# Patient Record
Sex: Male | Born: 1955 | Race: White | Hispanic: No | Marital: Married | Smoking: Former smoker
Health system: Southern US, Community
[De-identification: ages and names within clinical notes are randomized; demographics above are authoritative.]

## PROBLEM LIST (undated history)

## (undated) DIAGNOSIS — E119 Type 2 diabetes mellitus without complications: Secondary | ICD-10-CM

## (undated) HISTORY — PX: CYST REMOVAL TRUNK: SHX6283

---

## 2006-08-10 ENCOUNTER — Ambulatory Visit: Payer: Self-pay | Admitting: Surgery

## 2006-08-10 ENCOUNTER — Other Ambulatory Visit: Payer: Self-pay

## 2006-08-16 ENCOUNTER — Ambulatory Visit: Payer: Self-pay | Admitting: Surgery

## 2011-01-12 ENCOUNTER — Ambulatory Visit: Payer: Self-pay

## 2012-07-31 ENCOUNTER — Ambulatory Visit: Payer: Self-pay | Admitting: Emergency Medicine

## 2014-03-02 ENCOUNTER — Ambulatory Visit: Payer: Self-pay | Admitting: Family Medicine

## 2014-03-26 ENCOUNTER — Ambulatory Visit: Payer: Self-pay | Admitting: Gastroenterology

## 2014-04-29 ENCOUNTER — Ambulatory Visit
Admit: 2014-04-29 | Disposition: A | Discharge status: Home / Self Care | Payer: Self-pay | Attending: Family Medicine | Admitting: Family Medicine

## 2015-08-05 ENCOUNTER — Ambulatory Visit
Admission: EM | Admit: 2015-08-05 | Discharge: 2015-08-05 | Disposition: A | Discharge status: Home / Self Care | Payer: BLUE CROSS/BLUE SHIELD | Attending: Emergency Medicine | Admitting: Emergency Medicine

## 2015-08-05 DIAGNOSIS — M545 Low back pain, unspecified: Secondary | ICD-10-CM

## 2015-08-05 HISTORY — DX: Type 2 diabetes mellitus without complications: E11.9

## 2015-08-05 MED ORDER — METHOCARBAMOL 750 MG PO TABS: 1500.0000 mg | ORAL_TABLET | Freq: Three times a day (TID) | ORAL | Status: DC | PRN

## 2015-08-05 MED ORDER — PREDNISONE 10 MG PO TABS: ORAL_TABLET | ORAL | Status: DC

## 2015-08-05 NOTE — ED Notes (Signed)
Patient complains of right lower back pain. Patient states that when he bends over and then stands back up he feels a catching sensation in his back. Patient states that he felt a pulling in his back over a week ago when he got out of his truck. Patient states that pain has been going on now for about 4 weeks.

## 2015-08-05 NOTE — Discharge Instructions (Signed)
Take medication as prescribed. Rest. Drink plenty of fluids. Stretch. Avoid re-injury. Monitor blood sugar as discussed with medication.   Follow up with your primary care physician next week as scheduled. Return to Urgent care for new or worsening concerns.     Back Pain, Adult Back pain is very common in adults.The cause of back pain is rarely dangerous and the pain often gets better over time.The cause of your back pain may not be known. Some common causes of back pain include: 1. Strain of the muscles or ligaments supporting the spine. 2. Wear and tear (degeneration) of the spinal disks. 3. Arthritis. 4. Direct injury to the back. For many people, back pain may return. Since back pain is rarely dangerous, most people can learn to manage this condition on their own. HOME CARE INSTRUCTIONS Watch your back pain for any changes. The following actions may help to lessen any discomfort you are feeling: 1. Remain active. It is stressful on your back to sit or stand in one place for long periods of time. Do not sit, drive, or stand in one place for more than 30 minutes at a time. Take short walks on even surfaces as soon as you are able.Try to increase the length of time you walk each day. 2. Exercise regularly as directed by your health care provider. Exercise helps your back heal faster. It also helps avoid future injury by keeping your muscles strong and flexible. 3. Do not stay in bed.Resting more than 1-2 days can delay your recovery. 4. Pay attention to your body when you bend and lift. The most comfortable positions are those that put less stress on your recovering back. Always use proper lifting techniques, including: 1. Bending your knees. 2. Keeping the load close to your body. 3. Avoiding twisting. 5. Find a comfortable position to sleep. Use a firm mattress and lie on your side with your knees slightly bent. If you lie on your back, put a pillow under your knees. 6. Avoid feeling  anxious or stressed.Stress increases muscle tension and can worsen back pain.It is important to recognize when you are anxious or stressed and learn ways to manage it, such as with exercise. 7. Take medicines only as directed by your health care provider. Over-the-counter medicines to reduce pain and inflammation are often the most helpful.Your health care provider may prescribe muscle relaxant drugs.These medicines help dull your pain so you can more quickly return to your normal activities and healthy exercise. 8. Apply ice to the injured area: 1. Put ice in a plastic bag. 2. Place a towel between your skin and the bag. 3. Leave the ice on for 20 minutes, 2-3 times a day for the first 2-3 days. After that, ice and heat may be alternated to reduce pain and spasms. 9. Maintain a healthy weight. Excess weight puts extra stress on your back and makes it difficult to maintain good posture. SEEK MEDICAL CARE IF: 1. You have pain that is not relieved with rest or medicine. 2. You have increasing pain going down into the legs or buttocks. 3. You have pain that does not improve in one week. 4. You have night pain. 5. You lose weight. 6. You have a fever or chills. SEEK IMMEDIATE MEDICAL CARE IF:  1. You develop new bowel or bladder control problems. 2. You have unusual weakness or numbness in your arms or legs. 3. You develop nausea or vomiting. 4. You develop abdominal pain. 5. You feel faint.   This  information is not intended to replace advice given to you by your health care provider. Make sure you discuss any questions you have with your health care provider.   Document Released: 01/05/2005 Document Revised: 01/26/2014 Document Reviewed: 05/09/2013 Elsevier Interactive Patient Education 2016 [employer] Injury Prevention Back injuries can be very painful. They can also be difficult to heal. After having one back injury, you are more likely to injure your back again. It is  important to learn how to avoid injuring or re-injuring your back. The following tips can help you to prevent a back injury. WHAT SHOULD I KNOW ABOUT PHYSICAL FITNESS? 5. Exercise for 30 minutes per day on most days of the week or as directed by your health care provider. Make sure to: 1. Do aerobic exercises, such as walking, jogging, biking, or swimming. 2. Do exercises that increase balance and strength, such as tai chi and yoga. These can decrease your risk of falling and injuring your back. 3. Do stretching exercises to help with flexibility. 4. Try to develop strong abdominal muscles. Your abdominal muscles provide a lot of the support that is needed by your back. 6. Maintain a healthy weight. This helps to decrease your risk of a back injury. WHAT SHOULD I KNOW ABOUT MY DIET? 10. Talk with your health care provider about your overall diet. Take supplements and vitamins only as directed by your health care provider. 11. Talk with your health care provider about how much calcium and vitamin D you need each day. These nutrients help to prevent weakening of the bones (osteoporosis). Osteoporosis can cause broken (fractured) bones, which lead to back pain. 12. Include good sources of calcium in your diet, such as dairy products, green leafy vegetables, and products that have had calcium added to them (fortified). 13. Include good sources of vitamin D in your diet, such as milk and foods that are fortified with vitamin D. WHAT SHOULD I KNOW ABOUT MY POSTURE? 7. Sit up straight and stand up straight. Avoid leaning forward when you sit or hunching over when you stand. 8. Choose chairs that have good low-back (lumbar) support. 9. If you work at a desk, sit close to it so you do not need to lean over. Keep your chin tucked in. Keep your neck drawn back, and keep your elbows bent at a right angle. Your arms should look like the letter "L." 10. Sit high and close to the steering wheel when you drive.  Add a lumbar support to your car seat, if needed. 11. Avoid sitting or standing in one position for very long. Take breaks to get up, stretch, and walk around at least one time every hour. Take breaks every hour if you are driving for long periods of time. 12. Sleep on your side with your knees slightly bent, or sleep on your back with a pillow under your knees. Do not lie on the front of your body to sleep. WHAT SHOULD I KNOW ABOUT LIFTING, TWISTING, AND REACHING? Lifting and Heavy Lifting 6. Avoid heavy lifting, especially repetitive heavy lifting. If you must do heavy lifting: 1. Stretch before lifting. 2. Work slowly. 3. Rest between lifts. 4. Use a tool such as a cart or a dolly to move objects if one is available. 5. Make several small trips instead of carrying one heavy load. 6. Ask for help when you need it, especially when moving big objects. 7. Follow these steps when lifting: 1. Stand with your feet shoulder-width apart. 2.  Get as close to the object as you can. Do not try to pick up a heavy object that is far from your body. 3. Use handles or lifting straps if they are available. 4. Bend at your knees. Squat down, but keep your heels off the floor. 5. Keep your shoulders pulled back, your chin tucked in, and your back straight. 6. Lift the object slowly while you tighten the muscles in your legs, abdomen, and buttocks. Keep the object as close to the center of your body as possible. 8. Follow these steps when putting down a heavy load: 1. Stand with your feet shoulder-width apart. 2. Lower the object slowly while you tighten the muscles in your legs, abdomen, and buttocks. Keep the object as close to the center of your body as possible. 3. Keep your shoulders pulled back, your chin tucked in, and your back straight. 4. Bend at your knees. Squat down, but keep your heels off the floor. 5. Use handles or lifting straps if they are available. Twisting and Reaching 1. Avoid lifting  heavy objects above your waist. 2. Do not twist at your waist while you are lifting or carrying a load. If you need to turn, move your feet. 3. Do not bend over without bending at your knees. 4. Avoid reaching over your head, across a table, or for an object on a high surface. WHAT ARE SOME OTHER TIPS? 1. Avoid wet floors and icy ground. Keep sidewalks clear of ice to prevent falls. 2. Do not sleep on a mattress that is too soft or too hard. 3. Keep items that are used frequently within easy reach. 4. Put heavier objects on shelves at waist level, and put lighter objects on lower or higher shelves. 5. Find ways to decrease your stress, such as exercise, massage, or relaxation techniques. Stress can build up in your muscles. Tense muscles are more vulnerable to injury. 6. Talk with your health care provider if you feel anxious or depressed. These conditions can make back pain worse. 7. Wear flat heel shoes with cushioned soles. 8. Avoid sudden movements. 9. Use both shoulder straps when carrying a backpack. 10. Do not use any tobacco products, including cigarettes, chewing tobacco, or electronic cigarettes. If you need help quitting, ask your health care provider.   This information is not intended to replace advice given to you by your health care provider. Make sure you discuss any questions you have with your health care provider.   Document Released: 02/13/2004 Document Revised: 05/22/2014 Document Reviewed: 01/09/2014 Elsevier Interactive Patient Education 2016 [employer]  Back Exercises The following exercises strengthen the muscles that help to support the back. They also help to keep the lower back flexible. Doing these exercises can help to prevent back pain or lessen existing pain. If you have back pain or discomfort, try doing these exercises 2-3 times each day or as told by your health care provider. When the pain goes away, do them once each day, but increase the number of  times that you repeat the steps for each exercise (do more repetitions). If you do not have back pain or discomfort, do these exercises once each day or as told by your health care provider. EXERCISES Single Knee to Chest Repeat these steps 3-5 times for each leg: 7. Lie on your back on a firm bed or the floor with your legs extended. 8. Bring one knee to your chest. Your other leg should stay extended and in contact with the floor.  9. Hold your knee in place by grabbing your knee or thigh. 10. Pull on your knee until you feel a gentle stretch in your lower back. 11. Hold the stretch for 10-30 seconds. 12. Slowly release and straighten your leg. Pelvic Tilt Repeat these steps 5-10 times: 14. Lie on your back on a firm bed or the floor with your legs extended. [place] your knees so they are pointing toward the ceiling and your feet are flat on the floor. 50. Tighten your lower abdominal muscles to press your lower back against the floor. This motion will tilt your pelvis so your tailbone points up toward the ceiling instead of pointing to your feet or the floor. 17. With gentle tension and even breathing, hold this position for 5-10 seconds. Cat-Cow Repeat these steps until your lower back becomes more flexible: 13. Get into a hands-and-knees position on a firm surface. Keep your hands under your shoulders, and keep your knees under your hips. You may place padding under your knees for comfort. 14. Let your head hang down, and point your tailbone toward the floor so your lower back becomes rounded like the back of a cat. 15. Hold this position for 5 seconds. 16. Slowly lift your head and point your tailbone up toward the ceiling so your back forms a sagging arch like the back of a cow. 17. Hold this position for 5 seconds. Press-Ups Repeat these steps 5-10 times: 9. Lie on your abdomen (face-down) on the floor. 10. Place your palms near your head, about shoulder-width apart. 11. While you  keep your back as relaxed as possible and keep your hips on the floor, slowly straighten your arms to raise the top half of your body and lift your shoulders. Do not use your back muscles to raise your upper torso. You may adjust the placement of your hands to make yourself more comfortable. 12. Hold this position for 5 seconds while you keep your back relaxed. 13. Slowly return to lying flat on the floor. [place] Repeat these steps 10 times: 5. Lie on your back on a firm surface. 6. Bend your knees so they are pointing toward the ceiling and your feet are flat on the floor. 7. Tighten your buttocks muscles and lift your buttocks off of the floor until your waist is at almost the same height as your knees. You should feel the muscles working in your buttocks and the back of your thighs. If you do not feel these muscles, slide your feet 1-2 inches farther away from your buttocks. 8. Hold this position for 3-5 seconds. 9. Slowly lower your hips to the starting position, and allow your buttocks muscles to relax completely. If this exercise is too easy, try doing it with your arms crossed over your chest. Abdominal Crunches Repeat these steps 5-10 times: 11. Lie on your back on a firm bed or the floor with your legs extended. 12. Bend your knees so they are pointing toward the ceiling and your feet are flat on the floor. 55. Cross your arms over your chest. 14. Tip your chin slightly toward your chest without bending your neck. 73. Tighten your abdominal muscles and slowly raise your trunk (torso) high enough to lift your shoulder blades a tiny bit off of the floor. Avoid raising your torso higher than that, because it can put too much stress on your low back and it does not help to strengthen your abdominal muscles. 16. Slowly return to your starting position. Back Lifts  Repeat these steps 5-10 times: 1. Lie on your abdomen (face-down) with your arms at your sides, and rest your forehead on the  floor. 2. Tighten the muscles in your legs and your buttocks. 3. Slowly lift your chest off of the floor while you keep your hips pressed to the floor. Keep the back of your head in line with the curve in your back. Your eyes should be looking at the floor. 4. Hold this position for 3-5 seconds. 5. Slowly return to your starting position. SEEK MEDICAL CARE IF:  Your back pain or discomfort gets much worse when you do an exercise.  Your back pain or discomfort does not lessen within 2 hours after you exercise. If you have any of these problems, stop doing these exercises right away. Do not do them again unless your health care provider says that you can. SEEK IMMEDIATE MEDICAL CARE IF:  You develop sudden, severe back pain. If this happens, stop doing the exercises right away. Do not do them again unless your health care provider says that you can.   This information is not intended to replace advice given to you by your health care provider. Make sure you discuss any questions you have with your health care provider.   Document Released: 02/13/2004 Document Revised: 09/26/2014 Document Reviewed: 03/01/2014 Elsevier Interactive Patient Education [employer].

## 2015-08-05 NOTE — ED Provider Notes (Signed)
[name]  ____________________________________________  Time seen: Approximately 9:02 AM  I have reviewed the triage vital signs and the nursing notes.   HISTORY  Chief Complaint Back Pain   HPI Edward Gomez is a 60 y.o. male presents with a complaint of lower back pain that has been present intermittently for the last 4 weeks. Patient reports pain started 4 weeks ago is a very mild pain that has gradually worsened. Patient states that initially pain would come and go but states the last 2 weeks pain has been more persistent. Patient states that pain is only present with active movement, mostly with bending down and back up, describes as a catching pain. Denies any pain when sitting still or lying still. States pain is in his right low back.  Denies any other pain. Denies pain radiation. Denies numbness or tingling sensation, urinary or bowel retention or incontinence, sensation changes, weakness, extremity pain, extremity swelling. Patient denies any fall or trauma. Patient does report that 2 weeks ago he did twist his back which caused some increased pain. Patient states that he twisted his back when stepping down out of his truck but states he did not fall, and denies any direct trauma. Denies any known trauma or injury. Denies pain at this time. States works at a [employer], but denies heavy lifting or injury.  Patient reports he does have a history of similar approximate 2 years ago that he was treated with baclofen. Patient states he does have a few baclofen at home which he is taking which has not helped very much. Patient reports over-the-counter ibuprofen does help some. Reports has also taken Tylenol arthritis.  Denies fevers, pain radiation, sensation changes, weakness, chest pain, shortness of breath, abdominal pain, urinary or bowel complaints or recent sickness. Patient reports he has primary care appointment next week.  PCP: Kandice Robinsons   Past  Medical History  Diagnosis Date  . Diabetes ([place])     There are no active problems to display for this patient.   Past Surgical History  Procedure Laterality Date  . Cyst removal trunk      Current Outpatient Rx  Name  Route  Sig  Dispense  Refill  . lisinopril (PRINIVIL,ZESTRIL) 2.5 MG tablet   Oral   Take 2.5 mg by mouth daily.         . metFORMIN (GLUCOPHAGE) 500 MG tablet   Oral   Take 1,000 mg by mouth 2 (two) times daily with a meal.         .           .             Allergies Review of patient's allergies indicates no known allergies.  Family History  Problem Relation Age of Onset  . Cancer Father     Social History Social History  Substance Use Topics  . Smoking status: Never Smoker   . Smokeless tobacco: None  . Alcohol Use: 0.0 oz/week    0 Standard drinks or equivalent per week     Comment: occasionally    Review of Systems Constitutional: No fever/chills Eyes: No visual changes. ENT: No sore throat. Cardiovascular: Denies chest pain. Respiratory: Denies shortness of breath. Gastrointestinal: No abdominal pain.  No nausea, no vomiting.  No diarrhea.  No constipation. Genitourinary: Negative for dysuria. Musculoskeletal: positive for back pain. Skin: Negative for rash. Neurological: Negative for headaches, focal weakness or numbness.  10-point ROS otherwise negative.  ____________________________________________  PHYSICAL EXAM:  VITAL SIGNS: ED Triage Vitals  Enc Vitals Group     BP 08/05/15 0824 110/57 mmHg     Pulse Rate 08/05/15 0824 72     Resp 08/05/15 0824 17     Temp 08/05/15 0824 97.8 F (36.6 C)     Temp Source 08/05/15 0824 Oral     SpO2 08/05/15 0824 96 %     Weight 08/05/15 0824 298 lb (135.172 kg)     Height 08/05/15 0824 5\' 10"  (1.778 m)     Head Cir --      Peak Flow --      Pain Score 08/05/15 0823 7     Pain Loc --      Pain Edu? --      Excl. in [place]? --     Constitutional: Alert and oriented. Well  appearing and in no acute distress. Eyes: Conjunctivae are normal. PERRL. EOMI. Head: Atraumatic.  Ears: normal external appearance bilaterally.   Nose: No congestion/rhinnorhea.  Mouth/Throat: Mucous membranes are moist.  Oropharynx non-erythematous. Neck: No stridor.  No cervical spine tenderness to palpation. Cardiovascular: Normal rate, regular rhythm. Grossly normal heart sounds.  Good peripheral circulation. Respiratory: Normal respiratory effort.  No retractions. Lungs CTAB.No wheezes, rales or rhonchi.  Gastrointestinal: Soft and nontender. Obese abdomen.  Musculoskeletal: No lower or upper extremity tenderness nor edema. Bilateral pedal pulses equal and easily palpated. No midline cervical, thoracic or lumbar tenderness to palpation. Except: Mild tenderness to palpation right paralumbar, no midline tenderness, no left lower tenderness, no swelling, no ecchymosis, full range of motion, mild pain present with lumbar flexion and extension, bilateral straight leg test negative, no saddle anesthesia, steady gait. Patient changes positions from sitting to standing to ambulate quickly. Neurologic:  Normal speech and language. No gross focal neurologic deficits are appreciated. No gait instability. Skin:  Skin is warm, dry and intact. No rash noted. Psychiatric: Mood and affect are normal. Speech and behavior are normal.  ____________________________________________   LABS (all labs ordered are listed, but only abnormal results are displayed)  Labs Reviewed - No data to display ____________________________________________  RADIOLOGY  No results found. Patient declines _____________________________   INITIAL IMPRESSION / ASSESSMENT AND PLAN / ED COURSE  Pertinent labs & imaging results that were available during my care of the patient were reviewed by me and considered in my medical decision making (see chart for details).  Very well-appearing patient. No acute distress. Presents  for the complaints of right low back pain. Patient states pain is been present 4 weeks. Patient reports pain is only with position changes. Denies current pain sitting still. Patient mild tenderness to right para lumbar, no midline tenderness. Discussed evaluation and treatment options with patient, discussed x-ray with patient, patient declines x-ray at this time and reports will treat with medications and if pain continues, he will then have x-ray at that time. Will treat with oral prednisone taper and when necessary Robaxin. Encouraged patient to monitor blood sugar closely with prednisone. Encourage stretching, ice, proper body mechanics and avoidance of reinjury.Discussed indication, risks and benefits of medications with patient. Encouraged PCP follow-up and patient reports he has PCP follow up next week.   Discussed follow up with Primary care physician this week. Discussed follow up and return parameters including no resolution or any worsening concerns. Patient verbalized understanding and agreed to plan.   ____________________________________________   FINAL CLINICAL IMPRESSION(S) / ED DIAGNOSES  Final diagnoses:  Right-sided low back pain without sciatica  Discharge Medication List as of 08/05/2015  8:56 AM    START taking these medications   Details  methocarbamol (ROBAXIN-750) 750 MG tablet Take 2 tablets (1,500 mg total) by mouth every 8 (eight) hours as needed for muscle spasms (Do not drive or operate heavy machinery while taking medication as can cause drowsiness.)., Starting 08/05/2015, Until Discontinued, Print    predniSONE (DELTASONE) 10 MG tablet Start 60 mg po day one, then 50 mg po day two, taper by 10 mg daily until complete., Normal        Note: This dictation was prepared with Dragon dictation along with smaller phrase technology. Any transcriptional errors that result from this process are unintentional.       Marylene Land, NP 08/05/15 [phone]

## 2017-01-30 ENCOUNTER — Other Ambulatory Visit: Payer: Self-pay

## 2017-01-30 ENCOUNTER — Ambulatory Visit
Admission: EM | Admit: 2017-01-30 | Discharge: 2017-01-30 | Disposition: A | Discharge status: Home / Self Care | Payer: BLUE CROSS/BLUE SHIELD | Attending: Family Medicine | Admitting: Family Medicine

## 2017-01-30 DIAGNOSIS — M79671 Pain in right foot: Secondary | ICD-10-CM | POA: Diagnosis not present

## 2017-01-30 MED ORDER — NAPROXEN 500 MG PO TABS: 500.0000 mg | ORAL_TABLET | Freq: Two times a day (BID) | ORAL | 0 refills | Status: DC

## 2017-01-30 NOTE — ED Triage Notes (Signed)
Patient states he was on a cruise 3 weeks ago and thinks he "pulled" something in his right heel while pushing a wheelchair. Right heel pain x 3 weeks.

## 2017-01-30 NOTE — Discharge Instructions (Signed)
This is either achilles enthesopathy or bursitis around the achilles.  Rest.  Use the medication as directed.   If no improvement, please see podiatry.  Take care  Dr. Lacinda Axon

## 2017-01-30 NOTE — ED Provider Notes (Signed)
[name]   CSN: [id number] Arrival date & time: 01/30/17  0825  History   Chief Complaint Chief Complaint  Patient presents with  . Foot Pain   HPI  62 year old male presents with heel pain.  Patient reports that he has had this issue for the past 3 weeks.  He states that he was on a cruise and was pushing his wife in a wheelchair.  He subsequently developed pain of the right heel, posteriorly.  He states that he has continued to have pain despite rest, Tylenol, and topical agents.  Pain is moderate in severity, 7/10.  Worse with activity.  Improves with rest.  He denies any fall or twisting injury.  No other associated symptoms.  No other complaints at this time.  PMH - morbid obesity, hypertension, hyperlipidemia, type 2 diabetes with peripheral neuropathy  Surgical Hx -tonsillectomy, vasectomy  Home Medications    Prior to Admission medications   Medication Sig Start Date End Date Taking? Authorizing Provider  GLIMEPIRIDE PO Take by mouth.   Yes [provider]  lisinopril (PRINIVIL,ZESTRIL) 2.5 MG tablet Take 2.5 mg by mouth daily.   Yes [provider]  metFORMIN (GLUCOPHAGE) 500 MG tablet Take 1,000 mg by mouth 2 (two) times daily with a meal.   Yes [provider]  methocarbamol (ROBAXIN-750) 750 MG tablet Take 2 tablets (1,500 mg total) by mouth every 8 (eight) hours as needed for muscle spasms (Do not drive or operate heavy machinery while taking medication as can cause drowsiness.). 08/05/15  Yes Marylene Land, NP  naproxen (NAPROSYN) 500 MG tablet Take 1 tablet (500 mg total) by mouth 2 (two) times daily with a meal. 01/30/17   Coral Spikes, DO    Family History Diabetes type II Father    Gout Father    Depression Mother    Diabetes type II Mother    High blood pressure (Hypertension) Mother    Hyperlipidemia (Elevated cholesterol) Mother    Reflux disease Mother    Stroke Mother    Alcohol abuse Sister     Depression Sister     Social History Social History   Tobacco Use  . Smoking status: Never Smoker  . Smokeless tobacco: Never Used  Substance Use Topics  . Alcohol use: Yes    Alcohol/week: 0.0 oz    Comment: occasionally  . Drug use: No     Allergies   Patient has no known allergies.   Review of Systems Review of Systems  Constitutional: Negative.   Musculoskeletal:       Heel pain, right. No redness/warmth.   Physical Exam Triage Vital Signs ED Triage Vitals  Enc Vitals Group     BP 01/30/17 0849 (!) 115/57     Pulse Rate 01/30/17 0849 76     Resp --      Temp 01/30/17 0849 97.9 F (36.6 C)     Temp Source 01/30/17 0849 Oral     SpO2 01/30/17 0849 96 %     Weight 01/30/17 0849 295 lb (133.8 kg)     Height 01/30/17 0849 5\' 10"  (1.778 m)     Head Circumference --      Peak Flow --      Pain Score 01/30/17 0850 7     Pain Loc --      Pain Edu? --      Excl. in [place]? --    Updated Vital Signs BP (!) 115/57 (BP Location: Left Arm)  Pulse 76   Temp 97.9 F (36.6 C) (Oral)   Ht 5\' 10"  (1.778 m)   Wt 295 lb (133.8 kg)   SpO2 96%   BMI 42.33 kg/m     Physical Exam  Constitutional: He is oriented to person, place, and time. He appears well-developed. No distress.  Cardiovascular: Normal rate and regular rhythm.  No murmur heard. Pulmonary/Chest: Effort normal and breath sounds normal. He has no wheezes. He has no rales.  Musculoskeletal:  Right heel -patient with tenderness around the attachment site of the Achilles tendon.  No erythema.  No warmth.  Normal range of motion of the ankle.  No tenderness of the ankle.  Neurological: He is alert and oriented to person, place, and time.  Psychiatric: He has a normal mood and affect. His behavior is normal.  Nursing note and vitals reviewed.  UC Treatments / Results  Labs (all labs ordered are listed, but only abnormal results are displayed) Labs Reviewed - No data to display  EKG  EKG  Interpretation None       Radiology No results found.  Procedures Procedures (including critical care time)  Medications Ordered in UC Medications - No data to display   Initial Impression / Assessment and Plan / UC Course  I have reviewed the triage vital signs and the nursing notes.  Pertinent labs & imaging results that were available during my care of the patient were reviewed by me and considered in my medical decision making (see chart for details).     62 year old male presents with heel pain.  Achilles enthesopathy vs bursitis.  Treating with rest, brief course of naproxen, start of exercises/stretches.  Final Clinical Impressions(s) / UC Diagnoses   Final diagnoses:  Pain of right heel    ED Discharge Orders        Ordered    naproxen (NAPROSYN) 500 MG tablet  2 times daily with meals     01/30/17 0918     Controlled Substance Prescriptions [place] Controlled Substance Registry consulted? Not Applicable   Coral Spikes, [place] 01/30/17 1975

## 2017-02-02 ENCOUNTER — Telehealth: Payer: Self-pay | Admitting: Emergency Medicine

## 2017-02-02 NOTE — Telephone Encounter (Signed)
Left message to follow-up with patient regarding recent visit at [facility].

## 2017-02-06 ENCOUNTER — Other Ambulatory Visit: Payer: Self-pay | Admitting: Family Medicine

## 2017-02-20 ENCOUNTER — Other Ambulatory Visit: Payer: Self-pay

## 2017-02-20 ENCOUNTER — Ambulatory Visit
Admission: EM | Admit: 2017-02-20 | Discharge: 2017-02-20 | Disposition: A | Discharge status: Home / Self Care | Payer: BLUE CROSS/BLUE SHIELD | Attending: Family Medicine | Admitting: Family Medicine

## 2017-02-20 DIAGNOSIS — M62830 Muscle spasm of back: Secondary | ICD-10-CM

## 2017-02-20 MED ORDER — CYCLOBENZAPRINE HCL 10 MG PO TABS: 10.0000 mg | ORAL_TABLET | Freq: Three times a day (TID) | ORAL | 0 refills | Status: DC | PRN

## 2017-02-20 NOTE — ED Provider Notes (Signed)
[name]    CSN: [id number] Arrival date & time: 02/20/17  5621  History   Chief Complaint Chief Complaint  Patient presents with  . Back Pain   HPI  62 year old male presents with muscle spasms.  Started on Wednesday.  He states that he has had this previously.  He took baclofen without significant improvement.  He has had improvement with Flexeril previously.  His spasms are located in the musculature along the thoracic and lumbar spines.  Worse with movement and prolonged sitting.  No known relieving factors.  No other associated symptoms.  No other complaints at this time.  Past Medical History:  Diagnosis Date  . Diabetes [facility])    Past Surgical History:  Procedure Laterality Date  . CYST REMOVAL TRUNK      Home Medications    Prior to Admission medications   Medication Sig Start Date End Date Taking? Authorizing Provider  cyclobenzaprine (FLEXERIL) 10 MG tablet Take 1 tablet (10 mg total) by mouth 3 (three) times daily as needed. 02/20/17   Thersa Salt G, DO  GLIMEPIRIDE PO Take by mouth.    [provider]  lisinopril (PRINIVIL,ZESTRIL) 2.5 MG tablet Take 2.5 mg by mouth daily.    [provider]  metFORMIN (GLUCOPHAGE) 500 MG tablet Take 1,000 mg by mouth 2 (two) times daily with a meal.    [provider]    Family History Family History  Problem Relation Age of Onset  . Cancer Father     Social History Social History   Tobacco Use  . Smoking status: Never Smoker  . Smokeless tobacco: Never Used  Substance Use Topics  . Alcohol use: Yes    Alcohol/week: 0.0 oz    Comment: occasionally  . Drug use: No     Allergies   Patient has no known allergies.   Review of Systems Review of Systems  Constitutional: Negative.   Musculoskeletal:       Muscle cramps/spasms.   Physical Exam Triage Vital Signs ED Triage Vitals  Enc Vitals Group     BP 02/20/17 0903 (!) 169/85     Pulse Rate 02/20/17 0900 72     Resp  02/20/17 0900 18     Temp 02/20/17 0900 97.9 F (36.6 C)     Temp Source 02/20/17 0900 Oral     SpO2 02/20/17 0900 98 %     Weight 02/20/17 0905 295 lb (133.8 kg)     Height 02/20/17 0905 5\' 9"  (1.753 m)     Head Circumference --      Peak Flow --      Pain Score 02/20/17 0954 7     Pain Loc --      Pain Edu? --      Excl. in [place]? --    Updated Vital Signs BP (!) 169/85 (BP Location: Right Arm)   Pulse 72   Temp 97.9 F (36.6 C) (Oral)   Resp 18   Ht 5\' 9"  (1.753 m)   Wt 295 lb (133.8 kg)   SpO2 98%   BMI 43.56 kg/m   Physical Exam  Constitutional: He is oriented to person, place, and time. He appears well-developed and well-nourished. No distress.  Cardiovascular: Normal rate and regular rhythm.  Pulmonary/Chest: Effort normal and breath sounds normal.  Musculoskeletal:  Thoracic and lumbar spines -decreased range of motion secondary to pain.  Mild tenderness of the surrounding musculature.  Neurological: He is alert and oriented to person, place, and  time.  Psychiatric: He has a normal mood and affect. His behavior is normal.  Nursing note and vitals reviewed.    UC Treatments / Results  Labs (all labs ordered are listed, but only abnormal results are displayed) Labs Reviewed - No data to display  EKG  EKG Interpretation None       Radiology No results found.  Procedures Procedures (including critical care time)  Medications Ordered in UC Medications - No data to display   Initial Impression / Assessment and Plan / UC Course  I have reviewed the triage vital signs and the nursing notes.  Pertinent labs & imaging results that were available during my care of the patient were reviewed by me and considered in my medical decision making (see chart for details).     62 year old male presents with muscle spasms.  Treating with Flexeril.  Final Clinical Impressions(s) / UC Diagnoses   Final diagnoses:  Muscle spasm of back    ED Discharge Orders          Ordered    cyclobenzaprine (FLEXERIL) 10 MG tablet  3 times daily PRN     02/20/17 0951     Controlled Substance Prescriptions [place] Controlled Substance Registry consulted? Not Applicable   Coral Spikes, DO 02/20/17 1027

## 2017-02-20 NOTE — ED Triage Notes (Signed)
Pt with low/mid back spasms starting Wednesday night. Had some Baclofen which helped "a little" but still having pain and spasms. No injury. Pt reports this has happened before and Flexeril seemed to help.

## 2017-05-08 ENCOUNTER — Ambulatory Visit (INDEPENDENT_AMBULATORY_CARE_PROVIDER_SITE_OTHER): Payer: Worker's Compensation

## 2017-05-08 ENCOUNTER — Encounter: Payer: Self-pay | Admitting: Gynecology

## 2017-05-08 ENCOUNTER — Ambulatory Visit
Admission: EM | Admit: 2017-05-08 | Discharge: 2017-05-08 | Disposition: A | Discharge status: Home / Self Care | Payer: Worker's Compensation | Attending: Family Medicine | Admitting: Family Medicine

## 2017-05-08 ENCOUNTER — Other Ambulatory Visit: Payer: Self-pay

## 2017-05-08 DIAGNOSIS — M25571 Pain in right ankle and joints of right foot: Secondary | ICD-10-CM

## 2017-05-08 DIAGNOSIS — M25522 Pain in left elbow: Secondary | ICD-10-CM

## 2017-05-08 DIAGNOSIS — M25732 Osteophyte, left wrist: Secondary | ICD-10-CM | POA: Diagnosis not present

## 2017-05-08 DIAGNOSIS — W19XXXA Unspecified fall, initial encounter: Secondary | ICD-10-CM | POA: Diagnosis not present

## 2017-05-08 DIAGNOSIS — M25532 Pain in left wrist: Secondary | ICD-10-CM

## 2017-05-08 DIAGNOSIS — S52122A Displaced fracture of head of left radius, initial encounter for closed fracture: Secondary | ICD-10-CM

## 2017-05-08 MED ORDER — TRAMADOL HCL 50 MG PO TABS: 50.0000 mg | ORAL_TABLET | Freq: Three times a day (TID) | ORAL | 0 refills | Status: DC | PRN

## 2017-05-08 NOTE — Discharge Instructions (Signed)
Call [facility] or [facility] on Monday.  Be sure to use the sling.  Pain medication as needed.  Take care  Dr. Lacinda Axon

## 2017-05-08 NOTE — ED Provider Notes (Addendum)
[name]    CSN: [id number] Arrival date & time: 05/08/17  0800  History   Chief Complaint Chief Complaint  Patient presents with  . Fall   HPI  62 year old male presents with left wrist, left elbow, and right ankle pain after suffering a fall last night.  Patient states that he was at work last night and slipped on concrete due to the mud.  He fell on outstretched left hand. Patient reports that he has mild pain of his wrist but good range of motion.  His elbow pain is his primary concern.  Pain is severe.  Worse with range of motion.  Patient states that he is unable to extend his elbow.  No medications or interventions tried.  Additionally, patient reports right ankle pain with activity.  No other associated symptoms.  No other complaints.  Past Medical History:  Diagnosis Date  . Diabetes [facility])    Past Surgical History:  Procedure Laterality Date  . CYST REMOVAL TRUNK     Home Medications    Prior to Admission medications   Medication Sig Start Date End Date Taking? Authorizing Provider  GLIMEPIRIDE PO Take by mouth.   Yes [provider]  lisinopril (PRINIVIL,ZESTRIL) 2.5 MG tablet Take 2.5 mg by mouth daily.   Yes [provider]  metFORMIN (GLUCOPHAGE) 500 MG tablet Take 1,000 mg by mouth 2 (two) times daily with a meal.   Yes [provider]  traMADol (ULTRAM) 50 MG tablet Take 1 tablet (50 mg total) by mouth every 8 (eight) hours as needed. 05/08/17   Coral Spikes, DO   Family History Family History  Problem Relation Age of Onset  . Cancer Father    Social History Social History   Tobacco Use  . Smoking status: Never Smoker  . Smokeless tobacco: Never Used  Substance Use Topics  . Alcohol use: Yes    Alcohol/week: 0.0 oz    Comment: occasionally  . Drug use: No   Allergies   Patient has no known allergies.  Review of Systems Review of Systems  Constitutional: Negative.   Musculoskeletal:       Wrist pain,  Elbow pain and decreased ROM, Right ankle pain.   Physical Exam Triage Vital Signs ED Triage Vitals  Enc Vitals Group     BP 05/08/17 0811 (!) 142/84     Pulse Rate 05/08/17 0811 77     Resp 05/08/17 0811 16     Temp 05/08/17 0811 98.1 F (36.7 C)     Temp Source 05/08/17 0811 Oral     SpO2 05/08/17 0811 97 %     Weight 05/08/17 0814 300 lb (136.1 kg)     Height 05/08/17 0814 5\' 9"  (1.753 m)     Head Circumference --      Peak Flow --      Pain Score 05/08/17 0814 10     Pain Loc --      Pain Edu? --      Excl. in [place]? --    Updated Vital Signs BP (!) 142/84 (BP Location: Right Arm)   Pulse 77   Temp 98.1 F (36.7 C) (Oral)   Resp 16   Ht 5\' 9"  (1.753 m)   Wt 300 lb (136.1 kg)   SpO2 97%   BMI 44.30 kg/m   Physical Exam  Constitutional: He is oriented to person, place, and time. He appears well-developed. No distress.  HENT:  Head: Normocephalic and atraumatic.  Cardiovascular:  Normal rate and regular rhythm.  Pulmonary/Chest: Effort normal and breath sounds normal.  Musculoskeletal:  Left wrist -good range of motion.  No discrete areas of tenderness.  Left elbow -markedly tender to palpation around the radial head.  Decreased range of motion particularly in extension.  Right ankle -no appreciable swelling.  No tenderness of the foot or malleoli.  Neurological: He is alert and oriented to person, place, and time.  Psychiatric: He has a normal mood and affect. His behavior is normal.  Nursing note and vitals reviewed.  UC Treatments / Results  Labs (all labs ordered are listed, but only abnormal results are displayed) Labs Reviewed - No data to display  EKG None Radiology Dg Elbow Complete Left  Result Date: 05/08/2017 CLINICAL DATA:  Pt slipped and fell last night landing on left arm and wrist. C/o diffuse pain in left elbow and swelling and pain in left wrist. Best images possible, pt unable to straighten left elbow. EXAM: LEFT ELBOW - COMPLETE 3+ VIEW  COMPARISON:  None. FINDINGS: Radial head fracture, minimally displaced. Elbow effusion. Normal alignment. Degenerative spurs from the olecranon and coronoid process of the ulna. Normal mineralization. Adjacent to the coronary process of the ulna IMPRESSION: Minimally displaced radial head fracture with effusion. Electronically Signed   By: Lucrezia Europe M.D.   On: 05/08/2017 09:06   Dg Wrist Complete Left  Result Date: 05/08/2017 CLINICAL DATA:  Pt slipped and fell last night landing on left arm and wrist. C/o diffuse pain in left elbow and swelling and pain in left wrist. Best images possible, pt unable to straighten left elbow. EXAM: LEFT WRIST - COMPLETE 3+ VIEW COMPARISON:  None. FINDINGS: Small ossific density at the dorsal margin of the distal radial articular surface favor small spur over fracture fragment. There is no other evidence of fracture or dislocation. There is no other evidence of arthropathy or other focal bone abnormality. Soft tissues are unremarkable. IMPRESSION: Small dorsal distal radial spur versus fracture fragment. Correlate with point tenderness. Electronically Signed   By: Lucrezia Europe M.D.   On: 05/08/2017 09:04    Procedures Procedures (including critical care time)  Medications Ordered in UC Medications - No data to display   Initial Impression / Assessment and Plan / UC Course  I have reviewed the triage vital signs and the nursing notes.  Pertinent labs & imaging results that were available during my care of the patient were reviewed by me and considered in my medical decision making (see chart for details).     62 year old male presents for evaluation following a fall.  Exam of the elbow concerning for fracture.  Radial head fracture noted on x-ray.  Treating with tramadol and placing in a sling.  Advised to see orthopedics on Monday.  Final Clinical Impressions(s) / UC Diagnoses   Final diagnoses:  Closed displaced fracture of head of left radius, initial  encounter    ED Discharge Orders        Ordered    traMADol (ULTRAM) 50 MG tablet  Every 8 hours PRN     05/08/17 0912     Controlled Substance Prescriptions [place] Controlled Substance Registry consulted? Not Applicable   Coral Spikes, DO 05/08/17 [place], Lillie, DO 05/11/17 1121

## 2017-05-08 NOTE — ED Notes (Signed)
Sling applied to left arm.

## 2017-05-08 NOTE — ED Triage Notes (Signed)
Per patient fell x last night and injury his left arm and right ankle.

## 2017-11-21 ENCOUNTER — Ambulatory Visit
Admission: EM | Admit: 2017-11-21 | Discharge: 2017-11-21 | Disposition: A | Discharge status: Home / Self Care | Payer: BLUE CROSS/BLUE SHIELD

## 2017-11-21 ENCOUNTER — Encounter: Payer: Self-pay | Admitting: Gynecology

## 2017-11-21 ENCOUNTER — Ambulatory Visit (INDEPENDENT_AMBULATORY_CARE_PROVIDER_SITE_OTHER): Payer: BLUE CROSS/BLUE SHIELD

## 2017-11-21 DIAGNOSIS — M545 Low back pain, unspecified: Secondary | ICD-10-CM

## 2017-11-21 DIAGNOSIS — M5136 Other intervertebral disc degeneration, lumbar region: Secondary | ICD-10-CM

## 2017-11-21 MED ORDER — DICLOFENAC SODIUM 1 % TD GEL: 2.0000 g | Freq: Four times a day (QID) | TRANSDERMAL | 0 refills | Status: DC

## 2017-11-21 MED ORDER — CYCLOBENZAPRINE HCL 10 MG PO TABS: ORAL_TABLET | ORAL | 0 refills | Status: AC

## 2017-11-21 NOTE — ED Triage Notes (Signed)
Patient c/o back pain x 3-4 weeks.

## 2017-11-21 NOTE — ED Provider Notes (Signed)
[name]    CSN: [id number] Arrival date & time: 11/21/17  1210     History   Chief Complaint No chief complaint on file.   HPI Edward Gomez is a 62 y.o. male.   62 year old male presents with intermittent back pain for the past 3 to 4 weeks. Has had lower back pain and muscle spasms in the past but this pain feels different. No distinct injury or trauma. Indicates the pain feels like achy and stabbing pain on the right lower lumbar area and it feels better with massage and pressure to the area. He has tried Ibuprofen the first week or two with minimal relief. Recently he tried Baclofen with no relief. Indicates that he has had Flexeril in the past which seems to work better. Denies any fever, chest pain, difficulty breathing, coughing, abdominal pain, dysuria, or change in bowel habit. Other chronic health issues include DM and hyperlipidemia and currently on Metformin, Glimepiride, and aspirin daily. Indicates he takes Lisinopril for kidney protection from diabetes and does not have HTN.   The history is provided by the patient.    Past Medical History:  Diagnosis Date  . Diabetes ([place])     There are no active problems to display for this patient.   Past Surgical History:  Procedure Laterality Date  . CYST REMOVAL TRUNK         Home Medications    Prior to Admission medications   Medication Sig Start Date End Date Taking? Authorizing Provider  aspirin EC 81 MG tablet Take by mouth.   Yes [provider]  b complex vitamins tablet Take by mouth.   Yes [provider]  GLIMEPIRIDE PO Take by mouth.   Yes [provider]  lisinopril (PRINIVIL,ZESTRIL) 2.5 MG tablet Take 2.5 mg by mouth daily.   Yes [provider]  metFORMIN (GLUCOPHAGE) 500 MG tablet Take 1,000 mg by mouth 2 (two) times daily with a meal.   Yes [provider]  cyclobenzaprine (FLEXERIL) 10 MG tablet Take 1/2 to 1 whole tablet by  mouth every 8 hours as needed for back pain/spasms. 11/21/17   Katy Apo, NP  diclofenac sodium (VOLTAREN) 1 % GEL Apply 2 g topically 4 (four) times daily. 11/21/17   Katy Apo, NP    Family History Family History  Problem Relation Age of Onset  . Cancer Father     Social History Social History   Tobacco Use  . Smoking status: Never Smoker  . Smokeless tobacco: Never Used  Substance Use Topics  . Alcohol use: Yes    Alcohol/week: 0.0 standard drinks    Comment: occasionally  . Drug use: No     Allergies   Lovastatin and Pravastatin   Review of Systems Review of Systems  Constitutional: Negative for activity change, appetite change, chills, diaphoresis, fatigue, fever and unexpected weight change.  Eyes: Negative for photophobia and visual disturbance.  Respiratory: Negative for cough, chest tightness, shortness of breath and wheezing.   Cardiovascular: Negative for chest pain and palpitations.  Gastrointestinal: Negative for abdominal pain, nausea and vomiting.  Genitourinary: Negative for decreased urine volume, difficulty urinating, dysuria, flank pain, hematuria and urgency.  Musculoskeletal: Positive for back pain and myalgias. Negative for arthralgias, gait problem, neck pain and neck stiffness.  Skin: Negative for color change, rash and wound.  Neurological: Negative for dizziness, tremors, seizures, syncope, facial asymmetry, speech difficulty, weakness, light-headedness, numbness and headaches.  Hematological: Negative for  adenopathy. Does not bruise/bleed easily.  Psychiatric/Behavioral: Negative.      Physical Exam Triage Vital Signs ED Triage Vitals  Enc Vitals Group     BP 11/21/17 1248 134/74     Pulse Rate 11/21/17 1248 68     Resp --      Temp 11/21/17 1248 98 F (36.7 C)     Temp src --      SpO2 11/21/17 1248 98 %     Weight 11/21/17 1247 300 lb (136.1 kg)     Height 11/21/17 1247 5\' 9"  (1.753 m)     Head Circumference --       Peak Flow --      Pain Score 11/21/17 1246 5     Pain Loc --      Pain Edu? --      Excl. in [place]? --    No data found.  Updated Vital Signs BP 134/74 (BP Location: Left Arm)   Pulse 68   Temp 98 F (36.7 C)   Ht 5\' 9"  (1.753 m)   Wt 300 lb (136.1 kg)   SpO2 98%   BMI 44.30 kg/m   Visual Acuity Right Eye Distance:   Left Eye Distance:   Bilateral Distance:    Right Eye Near:   Left Eye Near:    Bilateral Near:     Physical Exam  Constitutional: He is oriented to person, place, and time. Vital signs are normal. He appears well-developed and well-nourished. He is cooperative. He does not appear ill. No distress.  Patient sitting comfortably in exam chair in no acute distress. Indicates leaning on back of chair helps with pain.   HENT:  Head: Normocephalic and atraumatic.  Right Ear: External ear normal.  Left Ear: External ear normal.  Eyes: Conjunctivae and EOM are normal.  Neck: Normal range of motion. Neck supple.  Cardiovascular: Normal rate, regular rhythm and normal heart sounds.  No murmur heard. Pulmonary/Chest: Effort normal and breath sounds normal. No stridor. No respiratory distress. He has no decreased breath sounds. He has no wheezes. He has no rhonchi. He has no rales.  Musculoskeletal: He exhibits tenderness.       Lumbar back: He exhibits decreased range of motion, tenderness, pain and spasm. He exhibits no swelling, no edema, no deformity and no laceration.       Back:  Has decreased range of motion of lower back, especially with flexion. Pain with any movement. Right lower lumbar area with occasional muscle spasms and slightly tender. No swelling, redness or rash. No distinct neuro deficits noted. Good distal pulses and sensation.   Lymphadenopathy:    He has no cervical adenopathy.  Neurological: He is alert and oriented to person, place, and time. He has normal strength and normal reflexes. No sensory deficit. He displays a negative Romberg sign. Gait  normal.  Skin: Skin is warm and dry. Capillary refill takes less than 2 seconds. No rash noted. No erythema.  Psychiatric: He has a normal mood and affect. His behavior is normal. Judgment and thought content normal.  Vitals reviewed.    UC Treatments / Results  Labs (all labs ordered are listed, but only abnormal results are displayed) Labs Reviewed - No data to display  EKG None  Radiology Dg Lumbar Spine Complete  Result Date: 11/21/2017 CLINICAL DATA:  Right lumbar pain. EXAM: LUMBAR SPINE - COMPLETE 4+ VIEW COMPARISON:  None. FINDINGS: There is no evidence of lumbar spine fracture. Alignment is normal. Degenerative disc  disease with disc height loss and facet arthropathy at L4-5 and L5-S1. Generalized osteopenia. 13 mm left renal calculus. Abdominal aortic atherosclerosis. IMPRESSION: Lower lumbar spine spondylosis. No acute osseous injury of the lumbar spine. Electronically Signed   By: Kathreen Devoid   On: 11/21/2017 14:37    Procedures Procedures (including critical care time)  Medications Ordered in UC Medications - No data to display  Initial Impression / Assessment and Plan / UC Course  I have reviewed the triage vital signs and the nursing notes.  Pertinent labs & imaging results that were available during my care of the patient were reviewed by me and considered in my medical decision making (see chart for details).    Discussed with patient that since this back pain is different from previous episodes, he is over age 1 and pain has continued for over 3 weeks with no distinct trauma or injury, we will obtain imaging to rule out mass or other lesion. Reviewed lumbar x-ray results with patient- essentially negative with osteopenia and degenerative disc disease. Renal calculi noted on opposite side of pain. Also abdominal aorta arthrosclerosis discussed.  Reviewed that he has some arthritis in his spine and probable muscle strain. Will trial Flexeril 10mg  1/2 to 1 tablet  every 8 hours as needed for muscle spasms. Recommend trial Voltaren gel- apply 3 to 4 times a day to affected areas as needed. Continue to monitor symptoms. May apply warm heat to area for comfort. Follow-up with his PCP in 4 to 5 days if back pain does not improve.  Final Clinical Impressions(s) / UC Diagnoses   Final diagnoses:  Acute right-sided low back pain without sciatica  Degenerative disc disease, lumbar     Discharge Instructions     Recommend start Voltaren gel- apply to affected area 3 to 4 times a day as needed. May take Flexeril 10mg  - 1/2 to 1 whole tablet every 8 hours as needed for pain/muscle spasms. Recommend continue applying heat to area for comfort. Follow-up with your PCP in 4 to 5 days if not improving.    ED Prescriptions    Medication Sig Dispense Auth. Provider   cyclobenzaprine (FLEXERIL) 10 MG tablet Take 1/2 to 1 whole tablet by mouth every 8 hours as needed for back pain/spasms. 20 tablet Katy Apo, NP   diclofenac sodium (VOLTAREN) 1 % GEL Apply 2 g topically 4 (four) times daily. 100 g Katy Apo, NP     Controlled Substance Prescriptions [place] Controlled Substance Registry consulted? Not Applicable   Katy Apo, NP 11/22/17 1112

## 2017-11-21 NOTE — Discharge Instructions (Addendum)
Recommend start Voltaren gel- apply to affected area 3 to 4 times a day as needed. May take Flexeril 10mg  - 1/2 to 1 whole tablet every 8 hours as needed for pain/muscle spasms. Recommend continue applying heat to area for comfort. Follow-up with your PCP in 4 to 5 days if not improving.

## 2018-03-20 ENCOUNTER — Other Ambulatory Visit: Payer: Self-pay

## 2018-03-20 ENCOUNTER — Ambulatory Visit
Admission: EM | Admit: 2018-03-20 | Discharge: 2018-03-20 | Disposition: A | Discharge status: Home / Self Care | Payer: BLUE CROSS/BLUE SHIELD | Attending: Family Medicine | Admitting: Family Medicine

## 2018-03-20 ENCOUNTER — Encounter: Payer: Self-pay | Admitting: Emergency Medicine

## 2018-03-20 DIAGNOSIS — R05 Cough: Secondary | ICD-10-CM | POA: Diagnosis not present

## 2018-03-20 DIAGNOSIS — B9789 Other viral agents as the cause of diseases classified elsewhere: Secondary | ICD-10-CM | POA: Diagnosis not present

## 2018-03-20 DIAGNOSIS — J0100 Acute maxillary sinusitis, unspecified: Secondary | ICD-10-CM | POA: Diagnosis not present

## 2018-03-20 DIAGNOSIS — R059 Cough, unspecified: Secondary | ICD-10-CM

## 2018-03-20 LAB — RAPID STREP SCREEN (MED CTR MEBANE ONLY): Streptococcus, Group A Screen (Direct): NEGATIVE

## 2018-03-20 MED ORDER — AMOXICILLIN 875 MG PO TABS: 875.0000 mg | ORAL_TABLET | Freq: Two times a day (BID) | ORAL | 0 refills | Status: DC

## 2018-03-20 NOTE — Discharge Instructions (Addendum)
Flonase nasal spray °

## 2018-03-20 NOTE — ED Provider Notes (Signed)
[name]    CSN: [id number] Arrival date & time: 03/20/18  0802     History   Chief Complaint Chief Complaint  Patient presents with  . Cough  . Sore Throat    HPI Edward Gomez is a 63 y.o. male.   The history is provided by the patient.  Cough  Associated symptoms: no wheezing   Sore Throat   URI  Presenting symptoms: congestion, cough, facial pain and fatigue   Severity:  Moderate Onset quality:  Sudden Duration:  1 week Timing:  Constant Progression:  Worsening Chronicity:  New Relieved by:  Nothing Ineffective treatments:  OTC medications Associated symptoms: sinus pain   Associated symptoms: no wheezing   Risk factors: sick contacts     Past Medical History:  Diagnosis Date  . Diabetes ([place])     There are no active problems to display for this patient.   Past Surgical History:  Procedure Laterality Date  . CYST REMOVAL TRUNK         Home Medications    Prior to Admission medications   Medication Sig Start Date End Date Taking? Authorizing Provider  aspirin EC 81 MG tablet Take by mouth.   Yes [provider]  b complex vitamins tablet Take by mouth.   Yes [provider]  GLIMEPIRIDE PO Take by mouth.   Yes [provider]  lisinopril (PRINIVIL,ZESTRIL) 2.5 MG tablet Take 2.5 mg by mouth daily.   Yes [provider]  metFORMIN (GLUCOPHAGE) 500 MG tablet Take 1,000 mg by mouth 2 (two) times daily with a meal.   Yes [provider]  amoxicillin (AMOXIL) 875 MG tablet Take 1 tablet (875 mg total) by mouth 2 (two) times daily. 03/20/18   Norval Gable, MD  cyclobenzaprine (FLEXERIL) 10 MG tablet Take 1/2 to 1 whole tablet by mouth every 8 hours as needed for back pain/spasms. 11/21/17   Katy Apo, NP  diclofenac sodium (VOLTAREN) 1 % GEL Apply 2 g topically 4 (four) times daily. 11/21/17   Katy Apo, NP    Family History Family History  Problem Relation Age of  Onset  . Cancer Father     Social History Social History   Tobacco Use  . Smoking status: Never Smoker  . Smokeless tobacco: Never Used  Substance Use Topics  . Alcohol use: Yes    Alcohol/week: 0.0 standard drinks    Comment: occasionally  . Drug use: No     Allergies   Lovastatin and Pravastatin   Review of Systems Review of Systems  Constitutional: Positive for fatigue.  HENT: Positive for congestion and sinus pain.   Respiratory: Positive for cough. Negative for wheezing.      Physical Exam Triage Vital Signs ED Triage Vitals  Enc Vitals Group     BP 03/20/18 0815 135/84     Pulse Rate 03/20/18 0815 71     Resp 03/20/18 0815 16     Temp 03/20/18 0815 97.7 F (36.5 C)     Temp Source 03/20/18 0815 Oral     SpO2 03/20/18 0815 96 %     Weight 03/20/18 0810 (!) 320 lb (145.2 kg)     Height 03/20/18 0810 5\' 8"  (1.727 m)     Head Circumference --      Peak Flow --      Pain Score 03/20/18 0810 5     Pain Loc --      Pain Edu? --  Excl. in [place]? --    No data found.  Updated Vital Signs BP 135/84 (BP Location: Right Arm)   Pulse 71   Temp 97.7 F (36.5 C) (Oral)   Resp 16   Ht 5\' 8"  (1.727 m)   Wt (!) 145.2 kg   SpO2 96%   BMI 48.66 kg/m   Visual Acuity Right Eye Distance:   Left Eye Distance:   Bilateral Distance:    Right Eye Near:   Left Eye Near:    Bilateral Near:     Physical Exam Vitals signs and nursing note reviewed.  Constitutional:      General: He is not in acute distress.    Appearance: He is well-developed. He is not toxic-appearing or diaphoretic.  HENT:     Nose:     Right Sinus: Maxillary sinus tenderness present.     Left Sinus: Maxillary sinus tenderness present.     Mouth/Throat:     Pharynx: Uvula midline. No oropharyngeal exudate.     Tonsils: No tonsillar exudate or tonsillar abscesses.  Neck:     Musculoskeletal: Normal range of motion and neck supple.     Thyroid: No thyromegaly.     Trachea: No tracheal  deviation.  Cardiovascular:     Rate and Rhythm: Normal rate and regular rhythm.     Heart sounds: Normal heart sounds.  Pulmonary:     Effort: Pulmonary effort is normal. No respiratory distress.     Breath sounds: Normal breath sounds. No stridor. No wheezing, rhonchi or rales.  Chest:     Chest wall: No tenderness.  Lymphadenopathy:     Cervical: No cervical adenopathy.  Skin:    General: Skin is warm and dry.     Findings: No rash.  Neurological:     Mental Status: He is alert.      UC Treatments / Results  Labs (all labs ordered are listed, but only abnormal results are displayed) Labs Reviewed  RAPID STREP SCREEN ([facility] ONLY)  CULTURE, GROUP A STREP [facility])    EKG None  Radiology No results found.  Procedures Procedures (including critical care time)  Medications Ordered in UC Medications - No data to display  Initial Impression / Assessment and Plan / UC Course  I have reviewed the triage vital signs and the nursing notes.  Pertinent labs & imaging results that were available during my care of the patient were reviewed by me and considered in my medical decision making (see chart for details).      Final Clinical Impressions(s) / UC Diagnoses   Final diagnoses:  Acute maxillary sinusitis, recurrence not specified  Cough    ED Prescriptions    Medication Sig Dispense Auth. Provider   amoxicillin (AMOXIL) 875 MG tablet Take 1 tablet (875 mg total) by mouth 2 (two) times daily. 20 tablet Norval Gable, MD     1. Lab results and diagnosis reviewed with patient 2. rx as per orders above; reviewed possible side effects, interactions, risks and benefits  3. Recommend supportive treatment as above 4. Follow-up prn if symptoms worsen or don't improve   Controlled Substance Prescriptions [place] Controlled Substance Registry consulted? Not Applicable   Norval Gable, MD 03/20/18 [phone]

## 2018-03-20 NOTE — ED Triage Notes (Signed)
Patient c/o cough, congestion and sore throat for a week.  Patient denies fevers.  Patient recently traveled on a cruise to the [place] and got back last Saturday.

## 2018-03-23 LAB — CULTURE, GROUP A STREP (THRC)

## 2019-09-22 ENCOUNTER — Encounter: Payer: Self-pay | Admitting: Emergency Medicine

## 2019-09-22 ENCOUNTER — Other Ambulatory Visit: Payer: Self-pay

## 2019-09-22 ENCOUNTER — Other Ambulatory Visit
Admission: RE | Admit: 2019-09-22 | Discharge: 2019-09-22 | Disposition: A | Discharge status: Home / Self Care | Payer: BC Managed Care – PPO | Source: Ambulatory Visit | Attending: Gastroenterology | Admitting: Gastroenterology

## 2019-09-22 DIAGNOSIS — Z20822 Contact with and (suspected) exposure to covid-19: Secondary | ICD-10-CM | POA: Diagnosis not present

## 2019-09-22 DIAGNOSIS — Z01812 Encounter for preprocedural laboratory examination: Secondary | ICD-10-CM | POA: Diagnosis not present

## 2019-09-22 LAB — SARS CORONAVIRUS 2 (TAT 6-24 HRS): SARS Coronavirus 2: NEGATIVE

## 2019-09-26 ENCOUNTER — Other Ambulatory Visit: Payer: BC Managed Care – PPO

## 2019-09-26 ENCOUNTER — Encounter
Admission: RE | Disposition: A | Discharge status: Home / Self Care | Payer: Self-pay | Source: Home / Self Care | Attending: Gastroenterology

## 2019-09-26 ENCOUNTER — Encounter: Payer: Self-pay | Admitting: *Deleted

## 2019-09-26 ENCOUNTER — Ambulatory Visit: Payer: BC Managed Care – PPO | Admitting: Certified Registered Nurse Anesthetist

## 2019-09-26 ENCOUNTER — Ambulatory Visit
Admission: RE | Admit: 2019-09-26 | Discharge: 2019-09-26 | Disposition: A | Discharge status: Home / Self Care | Payer: BC Managed Care – PPO | Attending: Gastroenterology | Admitting: Gastroenterology

## 2019-09-26 ENCOUNTER — Other Ambulatory Visit: Payer: Self-pay

## 2019-09-26 DIAGNOSIS — Z87891 Personal history of nicotine dependence: Secondary | ICD-10-CM | POA: Diagnosis not present

## 2019-09-26 DIAGNOSIS — Z791 Long term (current) use of non-steroidal anti-inflammatories (NSAID): Secondary | ICD-10-CM | POA: Insufficient documentation

## 2019-09-26 DIAGNOSIS — Z7984 Long term (current) use of oral hypoglycemic drugs: Secondary | ICD-10-CM | POA: Insufficient documentation

## 2019-09-26 DIAGNOSIS — D175 Benign lipomatous neoplasm of intra-abdominal organs: Secondary | ICD-10-CM | POA: Diagnosis not present

## 2019-09-26 DIAGNOSIS — K640 First degree hemorrhoids: Secondary | ICD-10-CM | POA: Diagnosis not present

## 2019-09-26 DIAGNOSIS — Z6841 Body Mass Index (BMI) 40.0 and over, adult: Secondary | ICD-10-CM | POA: Diagnosis not present

## 2019-09-26 DIAGNOSIS — K6289 Other specified diseases of anus and rectum: Secondary | ICD-10-CM | POA: Diagnosis not present

## 2019-09-26 DIAGNOSIS — Z79899 Other long term (current) drug therapy: Secondary | ICD-10-CM | POA: Insufficient documentation

## 2019-09-26 DIAGNOSIS — K635 Polyp of colon: Secondary | ICD-10-CM | POA: Insufficient documentation

## 2019-09-26 DIAGNOSIS — K621 Rectal polyp: Secondary | ICD-10-CM | POA: Insufficient documentation

## 2019-09-26 DIAGNOSIS — Z7982 Long term (current) use of aspirin: Secondary | ICD-10-CM | POA: Insufficient documentation

## 2019-09-26 DIAGNOSIS — E119 Type 2 diabetes mellitus without complications: Secondary | ICD-10-CM | POA: Insufficient documentation

## 2019-09-26 DIAGNOSIS — Z86010 Personal history of colonic polyps: Secondary | ICD-10-CM | POA: Insufficient documentation

## 2019-09-26 DIAGNOSIS — Z888 Allergy status to other drugs, medicaments and biological substances status: Secondary | ICD-10-CM | POA: Diagnosis not present

## 2019-09-26 DIAGNOSIS — Z1211 Encounter for screening for malignant neoplasm of colon: Secondary | ICD-10-CM | POA: Diagnosis present

## 2019-09-26 HISTORY — PX: COLONOSCOPY WITH PROPOFOL: SHX5780

## 2019-09-26 LAB — GLUCOSE, CAPILLARY: Glucose-Capillary: 174 mg/dL — ABNORMAL HIGH (ref 70–99)

## 2019-09-26 SURGERY — COLONOSCOPY WITH PROPOFOL
Anesthesia: General

## 2019-09-26 MED ORDER — LIDOCAINE HCL (PF) 2 % IJ SOLN
INTRAMUSCULAR | Status: AC
  Filled 2019-09-26: qty 10

## 2019-09-26 MED ORDER — PROPOFOL 10 MG/ML IV BOLUS
INTRAVENOUS | Status: DC | PRN
  Administered 2019-09-26: 20 mg via INTRAVENOUS
  Administered 2019-09-26: 80 mg via INTRAVENOUS

## 2019-09-26 MED ORDER — LIDOCAINE HCL (CARDIAC) PF 100 MG/5ML IV SOSY
PREFILLED_SYRINGE | INTRAVENOUS | Status: DC | PRN
  Administered 2019-09-26: 50 mg via INTRAVENOUS

## 2019-09-26 MED ORDER — SODIUM CHLORIDE 0.9 % IV SOLN: INTRAVENOUS | Status: DC

## 2019-09-26 MED ORDER — PROPOFOL 500 MG/50ML IV EMUL
INTRAVENOUS | Status: DC | PRN
  Administered 2019-09-26: 130 ug/kg/min via INTRAVENOUS

## 2019-09-26 NOTE — H&P (Signed)
Outpatient short stay form Pre-procedure 09/26/2019 9:00 AM Raylene Miyamoto MD, MPH  Primary Physician:  [facility]  Reason for visit:  Surveillance colonoscopy  History of present illness:   64 y/o gentleman with history of polyps here for surveillance colonoscopy. Two TA's 5 years ago. No abdominal surgeries. No family history of GI malignancies. No blood thinners.    Current Facility-Administered Medications:  .  0.9 %  sodium chloride infusion, , Intravenous, Continuous, Jonathandavid Marlett, Hilton Cork, MD, Last Rate: 20 mL/hr at 09/26/19 0834, New Bag at 09/26/19 0834  Medications Prior to Admission  Medication Sig Dispense Refill Last Dose  . aspirin EC 81 MG tablet Take by mouth.   Past Week at Unknown time  . b complex vitamins tablet Take by mouth.   Past Week at Unknown time  . Cholecalciferol 1.25 MG (50000 UT) capsule Take 50,000 Units by mouth daily.   Past Week at Unknown time  . cyclobenzaprine (FLEXERIL) 10 MG tablet Take 1/2 to 1 whole tablet by mouth every 8 hours as needed for back pain/spasms. 20 tablet 0 Past Month at Unknown time  . GLIMEPIRIDE PO Take by mouth.   Past Week at Unknown time  . lisinopril (PRINIVIL,ZESTRIL) 2.5 MG tablet Take 2.5 mg by mouth daily.   Past Week at Unknown time  . metFORMIN (GLUCOPHAGE) 500 MG tablet Take 1,000 mg by mouth 2 (two) times daily with a meal.   Past Week at Unknown time  . amoxicillin (AMOXIL) 875 MG tablet Take 1 tablet (875 mg total) by mouth 2 (two) times daily. (Patient not taking: Reported on 09/26/2019) 20 tablet 0 Completed Course at Unknown time  . diclofenac sodium (VOLTAREN) 1 % GEL Apply 2 g topically 4 (four) times daily. 100 g 0      Allergies  Allergen Reactions  . Lovastatin Other (See Comments)  . Pravastatin Other (See Comments)    Joint pain     Past Medical History:  Diagnosis Date  . Diabetes ([place])     Review of systems:  Otherwise negative.    Physical Exam  Gen: Alert, oriented. Appears stated age.   HEENT: [place]/AT. PERRLA. Lungs: No respiratory distress Abd: soft, benign, no masses.  Ext: No edema. Pulses 2+    Planned procedures: Proceed with colonoscopy. The patient understands the nature of the planned procedure, indications, risks, alternatives and potential complications including but not limited to bleeding, infection, perforation, damage to internal organs and possible oversedation/side effects from anesthesia. The patient agrees and gives consent to proceed.  Please refer to procedure notes for findings, recommendations and patient disposition/instructions.     Raylene Miyamoto MD, MPH Gastroenterology 09/26/2019  9:00 AM

## 2019-09-26 NOTE — Op Note (Signed)
[facility] Gastroenterology Patient Name: Edward Gomez Procedure Date: 09/26/2019 9:00 AM MRN: [id number] Account #: [id number] Date of Birth: Jun 01, 1955 Admit Type: Outpatient Age: 64 Room: [facility] ENDO ROOM 3 Gender: Male Note Status: Finalized Procedure:             Colonoscopy Indications:           Surveillance: Personal history of adenomatous polyps                         on last colonoscopy > 5 years ago Providers:             Andrey Farmer MD, MD Referring MD:          No Local Md, MD (Referring MD) Medicines:             Monitored Anesthesia Care Complications:         No immediate complications. Estimated blood loss:                         Minimal. Procedure:             Pre-Anesthesia Assessment:                        - Prior to the procedure, a History and Physical was                         performed, and patient medications and allergies were                         reviewed. The patient is competent. The risks and                         benefits of the procedure and the sedation options and                         risks were discussed with the patient. All questions                         were answered and informed consent was obtained.                         Patient identification and proposed procedure were                         verified by the physician, the nurse, the anesthetist                         and the technician in the endoscopy suite. Mental                         Status Examination: alert and oriented. Airway                         Examination: normal oropharyngeal airway and neck                         mobility. Respiratory Examination: clear to  auscultation. CV Examination: normal. Prophylactic                         Antibiotics: The patient does not require prophylactic                         antibiotics. Prior Anticoagulants: The patient has                         taken no previous  anticoagulant or antiplatelet                         agents. ASA Grade Assessment: III - A patient with                         severe systemic disease. After reviewing the risks and                         benefits, the patient was deemed in satisfactory                         condition to undergo the procedure. The anesthesia                         plan was to use monitored anesthesia care (MAC).                         Immediately prior to administration of medications,                         the patient was re-assessed for adequacy to receive                         sedatives. The heart rate, respiratory rate, oxygen                         saturations, blood pressure, adequacy of pulmonary                         ventilation, and response to care were monitored                         throughout the procedure. The physical status of the                         patient was re-assessed after the procedure.                        After obtaining informed consent, the colonoscope was                         passed under direct vision. Throughout the procedure,                         the patient's blood pressure, pulse, and oxygen                         saturations were monitored continuously. The  Colonoscope was introduced through the anus and                         advanced to the the cecum, identified by appendiceal                         orifice and ileocecal valve. The colonoscopy was                         performed without difficulty. The patient tolerated                         the procedure well. The quality of the bowel                         preparation was good. Findings:      The perianal and digital rectal examinations were normal.      A 5 mm polyp was found in the cecum. The polyp was sessile. The polyp       was removed with a cold snare. Resection and retrieval were complete.       Estimated blood loss was minimal.      There was a  small lipoma, in the ascending colon.      A 3 mm polyp was found in the proximal transverse colon. The polyp was       sessile. The polyp was removed with a cold snare. Resection and       retrieval were complete. Estimated blood loss was minimal.      A 3 mm polyp was found in the descending colon. The polyp was sessile.       The polyp was removed with a cold snare. Resection and retrieval were       complete. Estimated blood loss was minimal.      A 5 mm polyp was found in the rectum. The polyp was sessile. The polyp       was removed with a cold snare. Resection and retrieval were complete.       Estimated blood loss was minimal.      Non-bleeding internal hemorrhoids were found during retroflexion. The       hemorrhoids were Grade I (internal hemorrhoids that do not prolapse).      Anal papilla(e) were hypertrophied. Impression:            Appeared benign.                        Appeared hyperplastic.                        - One 5 mm polyp in the cecum, removed with a cold                         snare. Resected and retrieved.                        - Small lipoma in the ascending colon.                        - One 3 mm polyp in the proximal transverse colon,  removed with a cold snare. Resected and retrieved.                        - One 3 mm polyp in the descending colon, removed with                         a cold snare. Resected and retrieved.                        - One 5 mm polyp in the rectum, removed with a cold                         snare. Resected and retrieved.                        - Non-bleeding internal hemorrhoids.                        - Anal papilla(e) were hypertrophied. Recommendation:        - Discharge patient to home.                        - Resume previous diet.                        - Continue present medications.                        - Await pathology results.                        - Repeat colonoscopy for surveillance  based on                         pathology results.                        - Return to referring physician as previously                         scheduled. Procedure Code(s):     --- Professional ---                        [phone], Colonoscopy, flexible; with removal of                         tumor(s), polyp(s), or other lesion(s) by snare                         technique Diagnosis Code(s):     --- Professional ---                        Z86.010, Personal history of colonic polyps                        K63.5, Polyp of colon                        K62.1, Rectal polyp  D17.5, Benign lipomatous neoplasm of intra-abdominal                         organs                        K62.89, Other specified diseases of anus and rectum                        K64.0, First degree hemorrhoids CPT copyright 2019 American Medical Association. All rights reserved. The codes documented in this report are preliminary and upon coder review may  be revised to meet current compliance requirements. Andrey Farmer, MD Andrey Farmer MD, MD 09/26/2019 9:47:43 AM Number of Addenda: 0 Note Initiated On: 09/26/2019 9:00 AM Scope Withdrawal Time: 0 hours 20 minutes 28 seconds  Total Procedure Duration: 0 hours 22 minutes 4 seconds  Estimated Blood Loss:  Estimated blood loss was minimal.      [facility]

## 2019-09-26 NOTE — Anesthesia Postprocedure Evaluation (Signed)
Anesthesia Post Note  Patient: Edward Gomez  Procedure(s) Performed: COLONOSCOPY WITH PROPOFOL (N/A )  Patient location during evaluation: Endoscopy Anesthesia Type: General Level of consciousness: awake and alert Pain management: pain level controlled Vital Signs Assessment: post-procedure vital signs reviewed and stable Respiratory status: spontaneous breathing and respiratory function stable Cardiovascular status: stable Anesthetic complications: no   No complications documented.   Last Vitals:  Vitals:   09/26/19 1003 09/26/19 1013  BP: 128/76 134/78  Pulse:    Resp:    Temp:    SpO2:      Last Pain:  Vitals:   09/26/19 1013  TempSrc:   PainSc: 0-No pain                 Jayra Choyce K

## 2019-09-26 NOTE — Transfer of Care (Signed)
Immediate Anesthesia Transfer of Care Note  Patient: Kowen Kluth Galvan III  Procedure(s) Performed: COLONOSCOPY WITH PROPOFOL (N/A )  Patient Location: PACU  Anesthesia Type:General  Level of Consciousness: drowsy  Airway & Oxygen Therapy: Patient Spontanous Breathing and Patient connected to face mask oxygen  Post-op Assessment: Report given to RN and Post -op Vital signs reviewed and stable  Post vital signs: Reviewed and stable  Last Vitals:  Vitals Value Taken Time  BP 96/55 09/26/19 0943  Temp    Pulse 88 09/26/19 0943  Resp 14 09/26/19 0943  SpO2 96 % 09/26/19 0943  Vitals shown include unvalidated device data.  Last Pain:  Vitals:   09/26/19 0943  TempSrc: (P) Temporal  PainSc:          Complications: No complications documented.

## 2019-09-26 NOTE — Anesthesia Preprocedure Evaluation (Signed)
Anesthesia Evaluation  Patient identified by MRN, date of birth, ID band Patient awake    Reviewed: Allergy & Precautions, NPO status , Patient's Chart, lab work & pertinent test results  History of Anesthesia Complications Negative for: history of anesthetic complications  Airway Mallampati: III       Dental   Pulmonary neg sleep apnea, neg COPD, Not current smoker, former smoker,           Cardiovascular (-) hypertension(-) Past MI and (-) CHF (-) dysrhythmias (-) Valvular Problems/Murmurs     Neuro/Psych neg Seizures    GI/Hepatic Neg liver ROS, neg GERD  ,  Endo/Other  diabetes, Type 2, Oral Hypoglycemic AgentsMorbid obesity  Renal/GU negative Renal ROS     Musculoskeletal   Abdominal   Peds  Hematology   Anesthesia Other Findings   Reproductive/Obstetrics                             Anesthesia Physical Anesthesia Plan  ASA: III  Anesthesia Plan: General   Post-op Pain Management:    Induction: Intravenous  PONV Risk Score and Plan: 2 and Propofol infusion and TIVA  Airway Management Planned: Nasal Cannula  Additional Equipment:   Intra-op Plan:   Post-operative Plan:   Informed Consent: I have reviewed the patients History and Physical, chart, labs and discussed the procedure including the risks, benefits and alternatives for the proposed anesthesia with the patient or authorized representative who has indicated his/her understanding and acceptance.       Plan Discussed with:   Anesthesia Plan Comments:         Anesthesia Quick Evaluation

## 2019-09-27 ENCOUNTER — Encounter: Payer: Self-pay | Admitting: Gastroenterology

## 2019-09-27 LAB — SURGICAL PATHOLOGY

## 2020-01-20 IMAGING — CR DG LUMBAR SPINE COMPLETE 4+V
5 series · 5 of 5 positions shown · non-contrast
Comparison: None.

CLINICAL DATA: Right lumbar pain.

EXAM:
LUMBAR SPINE - COMPLETE 4+ VIEW

[l-spine ap]
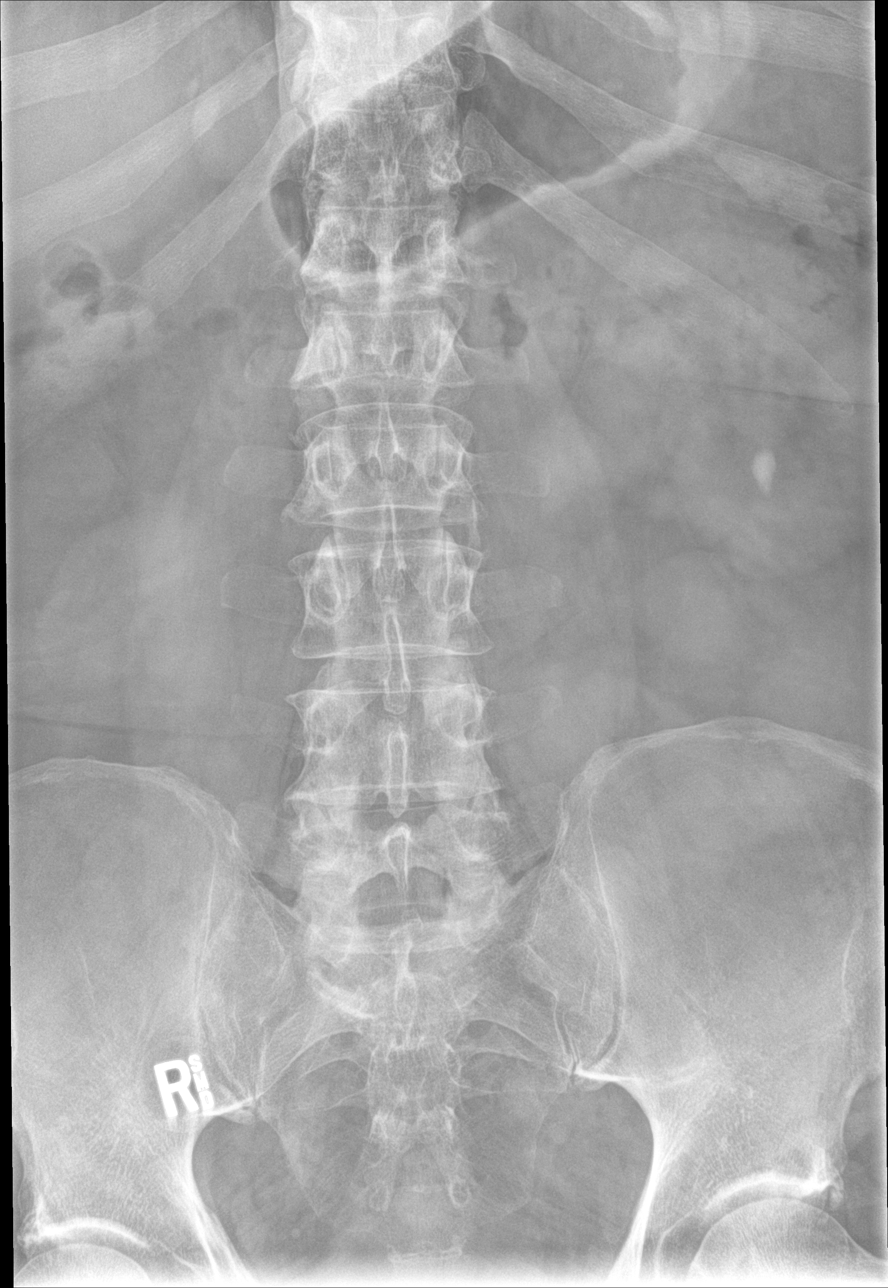

[l-spine obl (1 of 2)]
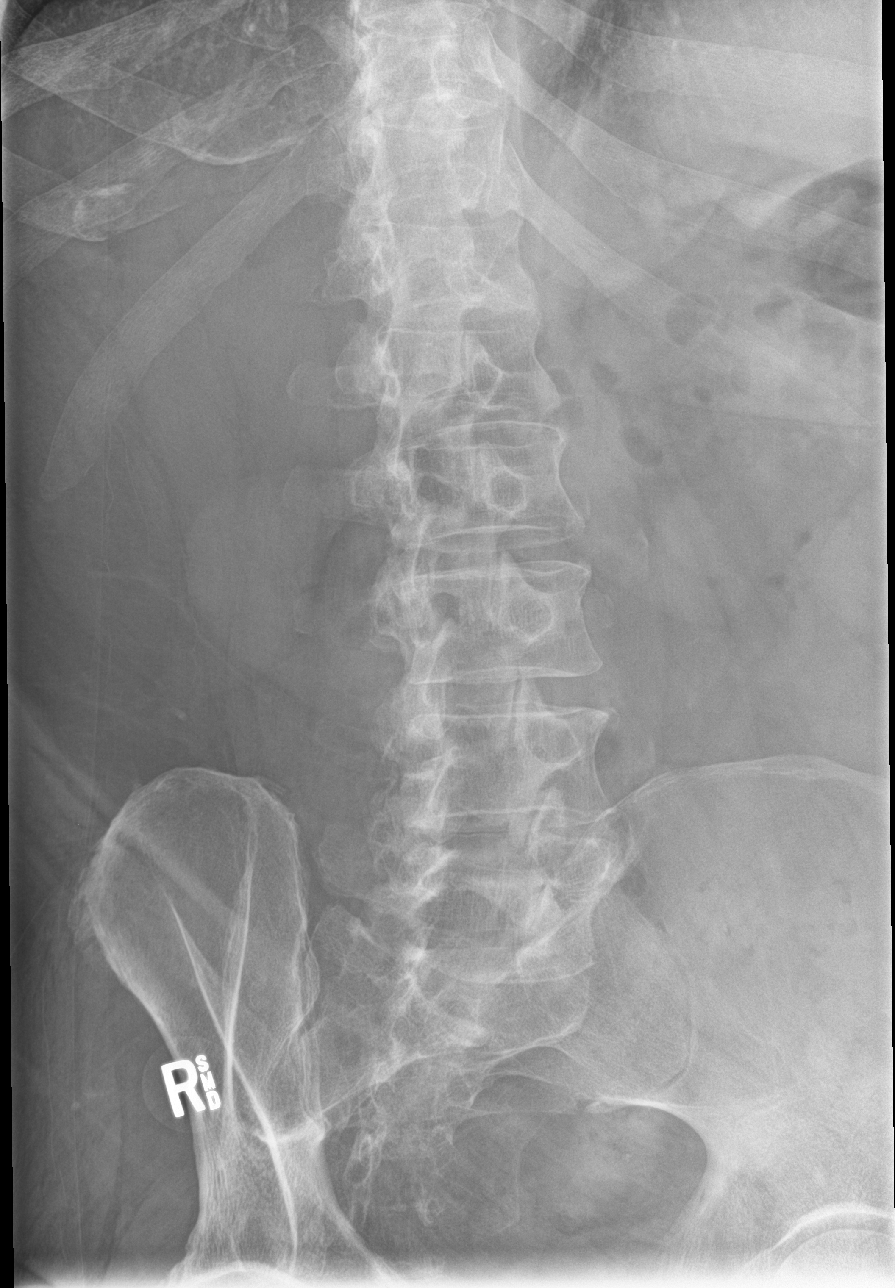

[l-spine obl (2 of 2)]
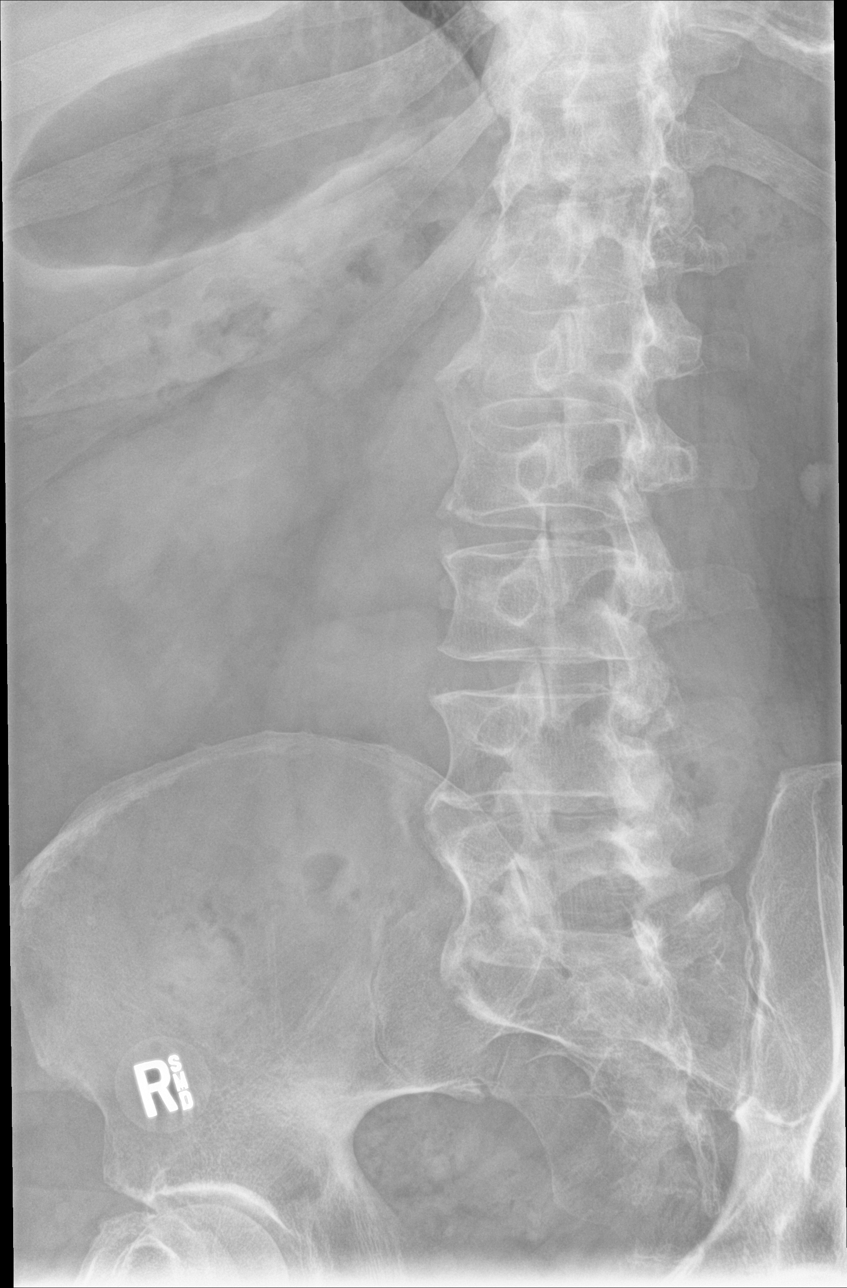

[l-spine lat]
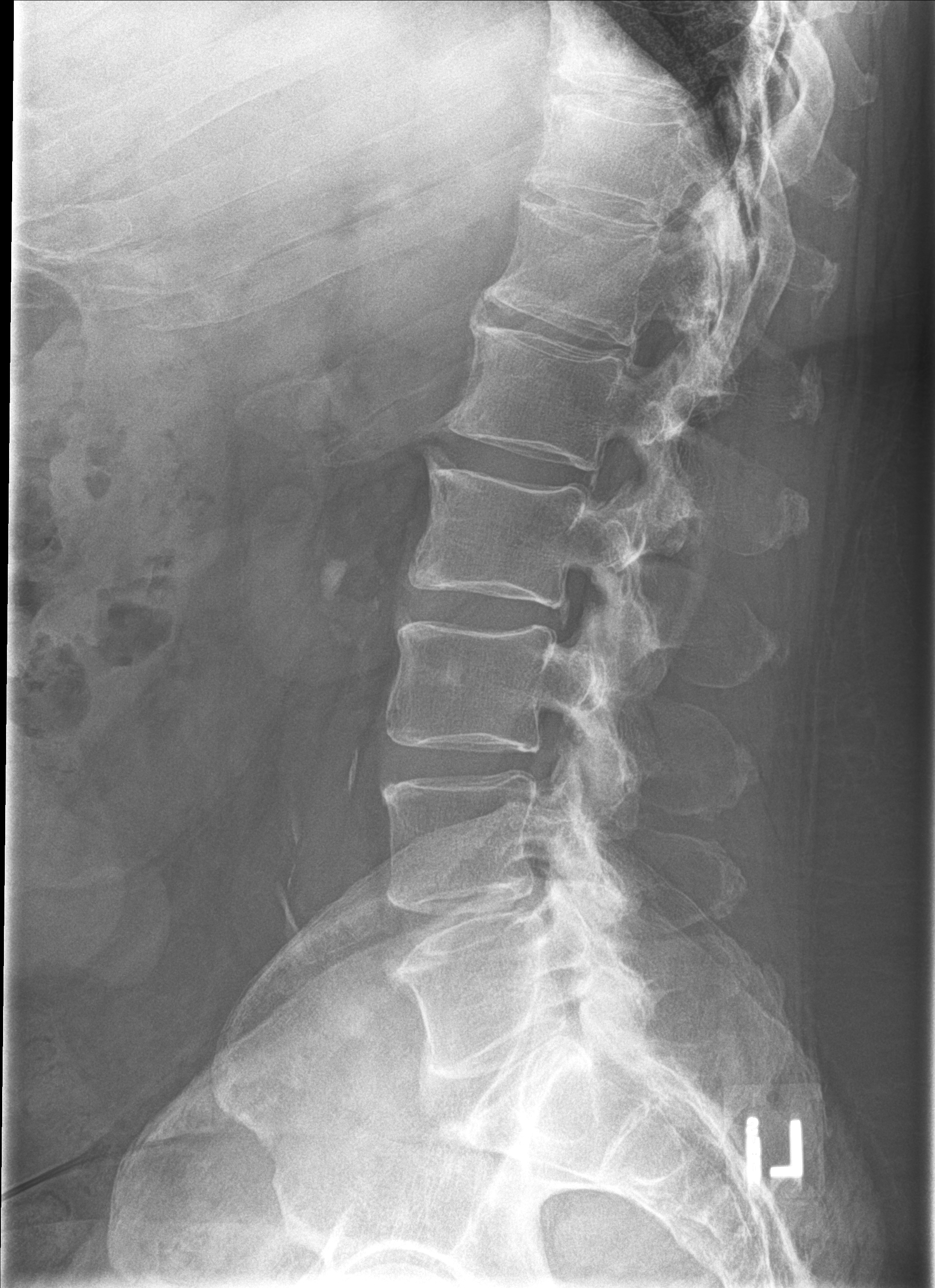

[l-spine spot]
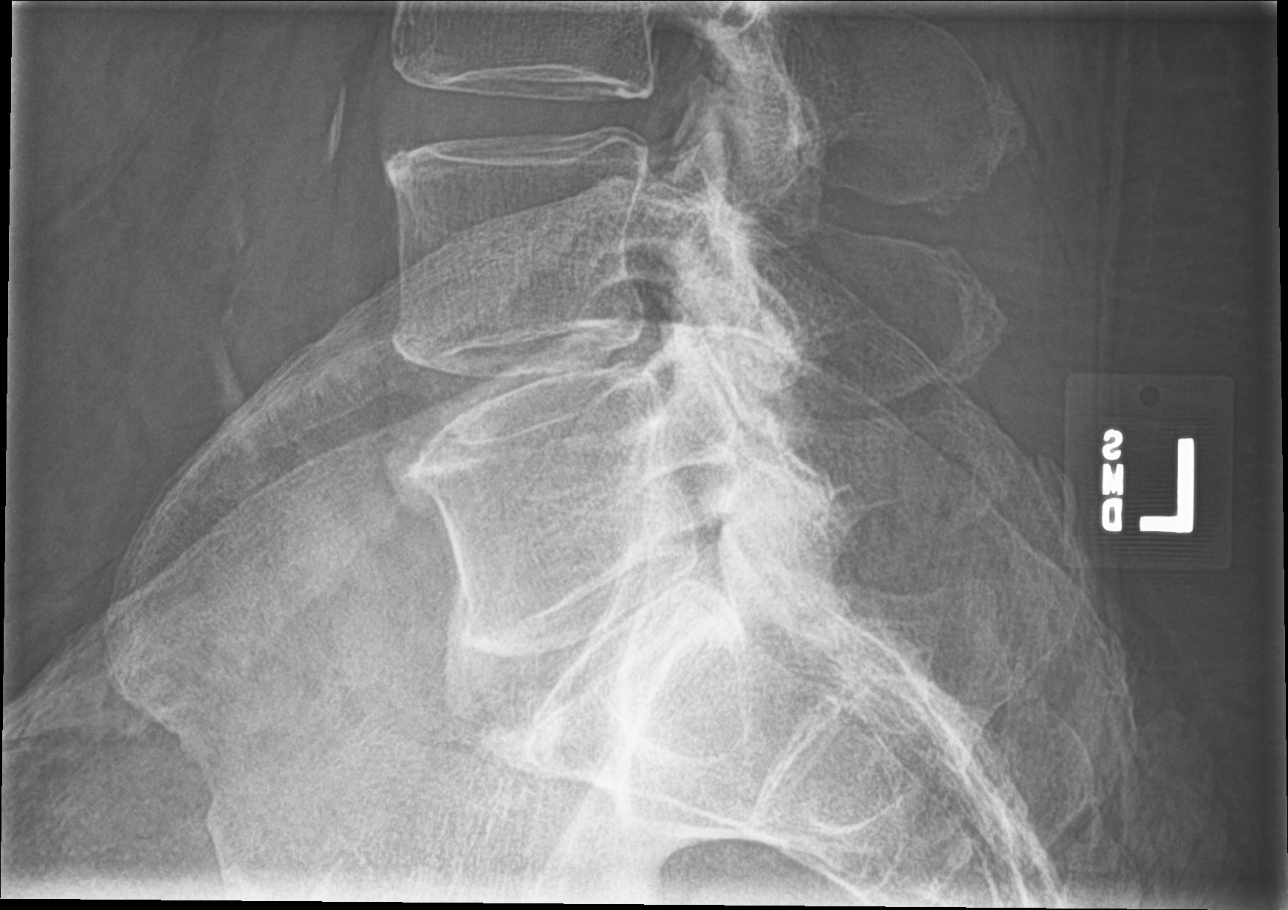

[5 of 5 positions shown; findings below may reference images not displayed]

FINDINGS: There is no evidence of lumbar spine fracture. Alignment is normal.
Degenerative disc disease with disc height loss and facet
arthropathy at L4-5 and L5-S1. Generalized osteopenia.

13 mm left renal calculus.

Abdominal aortic atherosclerosis.
IMPRESSION: Lower lumbar spine spondylosis.

No acute osseous injury of the lumbar spine.

## 2022-04-09 ENCOUNTER — Ambulatory Visit
Admission: EM | Admit: 2022-04-09 | Discharge: 2022-04-09 | Disposition: A | Discharge status: Home / Self Care | Payer: Medicare Other

## 2022-04-09 ENCOUNTER — Ambulatory Visit (INDEPENDENT_AMBULATORY_CARE_PROVIDER_SITE_OTHER): Payer: Medicare Other

## 2022-04-09 ENCOUNTER — Encounter: Payer: Self-pay | Admitting: Emergency Medicine

## 2022-04-09 DIAGNOSIS — M7702 Medial epicondylitis, left elbow: Secondary | ICD-10-CM

## 2022-04-09 DIAGNOSIS — M25522 Pain in left elbow: Secondary | ICD-10-CM

## 2022-04-09 MED ORDER — NAPROXEN 500 MG PO TABS: 500.0000 mg | ORAL_TABLET | Freq: Two times a day (BID) | ORAL | 0 refills | Status: AC

## 2022-04-09 NOTE — ED Provider Notes (Signed)
[name]    CSN: [id number] Arrival date & time: 04/09/22  1036      History   Chief Complaint Chief Complaint  Patient presents with   Elbow Pain    HPI Edward Gomez is a 67 y.o. male presenting for atraumatic left elbow pain x 1 week. Reports history of fracture of this elbow a few years ago.  Reports increased pain when extending elbow and trying to grab something.  Denies any pain radiating into forearm or hand.  No report of weakness or numbness.  Has tried Tylenol without relief.  HPI  Past Medical History:  Diagnosis Date   Diabetes ([place])     There are no problems to display for this patient.   Past Surgical History:  Procedure Laterality Date   COLONOSCOPY WITH PROPOFOL N/A 09/26/2019   Procedure: COLONOSCOPY WITH PROPOFOL;  Surgeon: Lesly Rubenstein, MD;  Location: [facility] ENDOSCOPY;  Service: Endoscopy;  Laterality: N/A;   CYST REMOVAL TRUNK         Home Medications    Prior to Admission medications   Medication Sig Start Date End Date Taking? Authorizing Provider  CRESTOR 5 MG tablet Take by mouth. 02/13/22  Yes [provider]  naproxen (NAPROSYN) 500 MG tablet Take 1 tablet (500 mg total) by mouth 2 (two) times daily. 04/09/22  Yes Laurene Footman B, PA-C  OZEMPIC, 0.25 OR 0.5 MG/DOSE, 2 MG/3ML SOPN Inject into the skin. 02/14/22  Yes [provider]  aspirin EC 81 MG tablet Take by mouth.    [provider]  b complex vitamins tablet Take by mouth.    [provider]  Cholecalciferol 1.25 MG (50000 UT) capsule Take 50,000 Units by mouth daily.    [provider]  cyclobenzaprine (FLEXERIL) 10 MG tablet Take 1/2 to 1 whole tablet by mouth every 8 hours as needed for back pain/spasms. 11/21/17   Katy Apo, NP  GLIMEPIRIDE PO Take by mouth.    [provider]  lisinopril (PRINIVIL,ZESTRIL) 2.5 MG tablet Take 2.5 mg by mouth daily.    [provider]  metFORMIN  (GLUCOPHAGE) 500 MG tablet Take 1,000 mg by mouth 2 (two) times daily with a meal.    [provider]    Family History Family History  Problem Relation Age of Onset   Cancer Father     Social History Social History   Tobacco Use   Smoking status: Former    Types: Cigarettes   Smokeless tobacco: Never  Vaping Use   Vaping Use: Never used  Substance Use Topics   Alcohol use: Yes    Alcohol/week: 0.0 standard drinks of alcohol    Comment: occasionally   Drug use: No     Allergies   Lovastatin, Pravastatin, and Other   Review of Systems Review of Systems  Musculoskeletal:  Positive for arthralgias. Negative for joint swelling and neck pain.  Skin:  Negative for color change and wound.  Neurological:  Negative for weakness and numbness.     Physical Exam Triage Vital Signs ED Triage Vitals [04/09/22 1101]  Enc Vitals Group     BP 127/60     Pulse Rate 75     Resp 16     Temp 97.8 F (36.6 C)     Temp Source Oral     SpO2 98 %     Weight      Height      Head Circumference  Peak Flow      Pain Score      Pain Loc      Pain Edu?      Excl. in [place]?    No data found.  Updated Vital Signs BP 127/60 (BP Location: Right Arm)   Pulse 75   Temp 97.8 F (36.6 C) (Oral)   Resp 16   SpO2 98%      Physical Exam Vitals and nursing note reviewed.  Constitutional:      General: He is not in acute distress.    Appearance: Normal appearance. He is well-developed. He is not ill-appearing.  HENT:     Head: Normocephalic and atraumatic.  Eyes:     General: No scleral icterus.    Conjunctiva/sclera: Conjunctivae normal.  Cardiovascular:     Rate and Rhythm: Normal rate.     Pulses: Normal pulses.  Pulmonary:     Effort: Pulmonary effort is normal. No respiratory distress.  Musculoskeletal:     Left elbow: No swelling or effusion. Normal range of motion. Tenderness present in medial epicondyle.     Cervical back: Neck supple.  Skin:    General:  Skin is warm and dry.     Capillary Refill: Capillary refill takes less than 2 seconds.  Neurological:     General: No focal deficit present.     Mental Status: He is alert. Mental status is at baseline.     Motor: No weakness.     Gait: Gait normal.  Psychiatric:        Mood and Affect: Mood normal.        Behavior: Behavior normal.      UC Treatments / Results  Labs (all labs ordered are listed, but only abnormal results are displayed) Labs Reviewed - No data to display  EKG   Radiology DG Elbow Complete Left  Result Date: 04/09/2022 CLINICAL DATA:  Acute left elbow pain without recent injury. EXAM: LEFT ELBOW - COMPLETE 3+ VIEW COMPARISON:  None Available. FINDINGS: There is no evidence of fracture, dislocation, or joint effusion. There is no evidence of arthropathy or other focal bone abnormality. Soft tissues are unremarkable. IMPRESSION: Negative. Electronically Signed   By: Marijo Conception M.D.   On: 04/09/2022 12:01    Procedures Procedures (including critical care time)  Medications Ordered in UC Medications - No data to display  Initial Impression / Assessment and Plan / UC Course  I have reviewed the triage vital signs and the nursing notes.  Pertinent labs & imaging results that were available during my care of the patient were reviewed by me and considered in my medical decision making (see chart for details).   67 y/o male presenting for left elbow pain. No injury. Previous fracture.  X-ray of left elbow obtained.  Negative.  Discussed results of x-ray with patient.  Advised patient he has medial epicondylitis.  We discussed use of brace, anti-inflammatory medications.  I sent naproxen to pharmacy. Reviewed RICE guidelines.  Reviewed following up with Ortho if no improvement over the next week or 2 or if symptoms acutely worsen.   Final Clinical Impressions(s) / UC Diagnoses   Final diagnoses:  Medial epicondylitis of left elbow  Left elbow pain      Discharge Instructions      -The x-ray is normal. - You likely have medial epicondylitis.  See the handout.  It is also called golfers elbow.  Use a brace below the elbow or above it depending on what  is most comfortable. - I have sent anti-inflammatory medicine.  You can also take Tylenol and use Voltaren gel.  Ice and elevate your extremity and avoid painful movements.  If no improvement over the next 1 to 2 weeks, follow-up with orthopedics.  You have a condition requiring you to follow up with Orthopedics so please call one of the following office for appointment:   [facility] [address] [place], [place] [address] Phone: [phone]  [facility] [address] [place], [place] [address] Phone: [phone]      ED Prescriptions     Medication Sig Dispense Auth. Provider   naproxen (NAPROSYN) 500 MG tablet Take 1 tablet (500 mg total) by mouth 2 (two) times daily. 30 tablet Gretta Cool      PDMP not reviewed this encounter.   Danton Clap, PA-C 04/09/22 1231

## 2022-04-09 NOTE — Discharge Instructions (Addendum)
-  The x-ray is normal. - You likely have medial epicondylitis.  See the handout.  It is also called golfers elbow.  Use a brace below the elbow or above it depending on what is most comfortable. - I have sent anti-inflammatory medicine.  You can also take Tylenol and use Voltaren gel.  Ice and elevate your extremity and avoid painful movements.  If no improvement over the next 1 to 2 weeks, follow-up with orthopedics.  You have a condition requiring you to follow up with Orthopedics so please call one of the following office for appointment:   [facility] [address] [place], [place] [address] Phone: [phone]  [facility] [address] [place], [place] [address] Phone: [phone]

## 2022-04-09 NOTE — ED Triage Notes (Signed)
Pt c/o left elbow pain x 1 week. He denies any recent injuries.  It hurts when he extends his arm. He does report fracturing his elbow a few years ago.
